# Patient Record
Sex: Female | Born: 2000 | Race: White | Hispanic: No | Marital: Single | Smoking: Current some day smoker
Health system: Southern US, Community
[De-identification: ages and names within clinical notes are randomized; demographics above are authoritative.]

## PROBLEM LIST (undated history)

## (undated) DIAGNOSIS — Z789 Other specified health status: Secondary | ICD-10-CM

## (undated) DIAGNOSIS — F32A Depression, unspecified: Secondary | ICD-10-CM

## (undated) DIAGNOSIS — F419 Anxiety disorder, unspecified: Secondary | ICD-10-CM

## (undated) HISTORY — DX: Anxiety disorder, unspecified: F41.9

## (undated) HISTORY — DX: Depression, unspecified: F32.A

---

## 2021-01-22 ENCOUNTER — Emergency Department (EMERGENCY_DEPARTMENT_HOSPITAL)
Admission: EM | Admit: 2021-01-22 | Discharge: 2021-01-24 | Disposition: A | Discharge status: Other Acute Inpatient Hospital | Payer: PRIVATE HEALTH INSURANCE | Source: Home / Self Care | Attending: Emergency Medicine | Admitting: Emergency Medicine

## 2021-01-22 ENCOUNTER — Other Ambulatory Visit: Payer: Self-pay

## 2021-01-22 DIAGNOSIS — Z20822 Contact with and (suspected) exposure to covid-19: Secondary | ICD-10-CM | POA: Insufficient documentation

## 2021-01-22 DIAGNOSIS — F17210 Nicotine dependence, cigarettes, uncomplicated: Secondary | ICD-10-CM | POA: Insufficient documentation

## 2021-01-22 DIAGNOSIS — R55 Syncope and collapse: Secondary | ICD-10-CM | POA: Diagnosis not present

## 2021-01-22 DIAGNOSIS — R4182 Altered mental status, unspecified: Secondary | ICD-10-CM | POA: Insufficient documentation

## 2021-01-22 DIAGNOSIS — F23 Brief psychotic disorder: Secondary | ICD-10-CM

## 2021-01-22 DIAGNOSIS — F411 Generalized anxiety disorder: Secondary | ICD-10-CM | POA: Insufficient documentation

## 2021-01-22 DIAGNOSIS — Y900 Blood alcohol level of less than 20 mg/100 ml: Secondary | ICD-10-CM | POA: Insufficient documentation

## 2021-01-22 LAB — CBC WITH DIFFERENTIAL/PLATELET
Abs Immature Granulocytes: 0.01 10*3/uL (ref 0.00–0.07)
Basophils Absolute: 0 10*3/uL (ref 0.0–0.1)
Basophils Relative: 0 %
Eosinophils Absolute: 0 10*3/uL (ref 0.0–0.5)
Eosinophils Relative: 0 %
HCT: 42.5 % (ref 36.0–46.0)
Hemoglobin: 15 g/dL (ref 12.0–15.0)
Immature Granulocytes: 0 %
Lymphocytes Relative: 14 %
Lymphs Abs: 1.2 10*3/uL (ref 0.7–4.0)
MCH: 28.8 pg (ref 26.0–34.0)
MCHC: 35.3 g/dL (ref 30.0–36.0)
MCV: 81.7 fL (ref 80.0–100.0)
Monocytes Absolute: 0.8 10*3/uL (ref 0.1–1.0)
Monocytes Relative: 9 %
Neutro Abs: 6.5 10*3/uL (ref 1.7–7.7)
Neutrophils Relative %: 77 %
Platelets: 370 10*3/uL (ref 150–400)
RBC: 5.2 MIL/uL — ABNORMAL HIGH (ref 3.87–5.11)
RDW: 13.3 % (ref 11.5–15.5)
WBC: 8.5 10*3/uL (ref 4.0–10.5)
nRBC: 0 % (ref 0.0–0.2)

## 2021-01-22 LAB — COMPREHENSIVE METABOLIC PANEL
ALT: 16 U/L (ref 0–44)
AST: 26 U/L (ref 15–41)
Albumin: 5.3 g/dL — ABNORMAL HIGH (ref 3.5–5.0)
Alkaline Phosphatase: 23 U/L — ABNORMAL LOW (ref 38–126)
Anion gap: 12 (ref 5–15)
BUN: 10 mg/dL (ref 6–20)
CO2: 20 mmol/L — ABNORMAL LOW (ref 22–32)
Calcium: 9.9 mg/dL (ref 8.9–10.3)
Chloride: 107 mmol/L (ref 98–111)
Creatinine, Ser: 0.65 mg/dL (ref 0.44–1.00)
GFR, Estimated: >60 mL/min (ref >60)
Glucose, Bld: 88 mg/dL (ref 70–99)
Potassium: 4.1 mmol/L (ref 3.5–5.1)
Sodium: 139 mmol/L (ref 135–145)
Total Bilirubin: 2.6 mg/dL — ABNORMAL HIGH (ref 0.3–1.2)
Total Protein: 8.2 g/dL — ABNORMAL HIGH (ref 6.5–8.1)

## 2021-01-22 LAB — URINALYSIS, COMPLETE (UACMP) WITH MICROSCOPIC
Bilirubin Urine: NEGATIVE
Glucose, UA: NEGATIVE mg/dL
Ketones, ur: >160 mg/dL — AB
Nitrite: NEGATIVE
Protein, ur: NEGATIVE mg/dL
Specific Gravity, Urine: 1.015 (ref 1.005–1.030)
pH: 6 (ref 5.0–8.0)

## 2021-01-22 LAB — PREGNANCY, URINE: Preg Test, Ur: NEGATIVE

## 2021-01-22 LAB — URINE DRUG SCREEN, QUALITATIVE (ARMC ONLY)
Amphetamines, Ur Screen: NOT DETECTED
Barbiturates, Ur Screen: NOT DETECTED
Benzodiazepine, Ur Scrn: NOT DETECTED
Cannabinoid 50 Ng, Ur ~~LOC~~: POSITIVE — AB
Cocaine Metabolite,Ur ~~LOC~~: NOT DETECTED
MDMA (Ecstasy)Ur Screen: NOT DETECTED
Methadone Scn, Ur: NOT DETECTED
Opiate, Ur Screen: NOT DETECTED
Phencyclidine (PCP) Ur S: NOT DETECTED
Tricyclic, Ur Screen: NOT DETECTED

## 2021-01-22 LAB — SALICYLATE LEVEL: Salicylate Lvl: <7 mg/dL — ABNORMAL LOW (ref 7.0–30.0)

## 2021-01-22 LAB — ACETAMINOPHEN LEVEL: Acetaminophen (Tylenol), Serum: <10 ug/mL — ABNORMAL LOW (ref 10–30)

## 2021-01-22 LAB — ETHANOL: Alcohol, Ethyl (B): <10 mg/dL (ref <10)

## 2021-01-22 MED ORDER — TRAZODONE HCL 50 MG PO TABS
50.0000 mg | ORAL_TABLET | Freq: Every evening | ORAL | Status: DC | PRN
  Administered 2021-01-22 – 2021-01-23 (×2): 50 mg via ORAL
  Filled 2021-01-22 (×2): qty 1

## 2021-01-22 NOTE — BH Assessment (Signed)
Comprehensive Clinical Assessment (CCA) Note  01/22/2021 Sandra [name] [id number]  Chief Complaint: Patient is a 20 year old female presenting to [facility] ED voluntarily. Per triage note Pt [facility] [employer]. States roommates, counselor did wellness check d/t abnormal behavior, pt not getting out of bed until 10 pm last night. Told roommate "I feel like the universe is telling me I should shower, should I?" Pt denies si/hi. Endorses that she is having difficulty adjusting here and was isolated in her room for a long time d/t having covid. Pt somewhat answer questions in altered way, has to be asked questions several times in different forms. When asked about pain, pt states "when I had covid, I had time to sit and think about what hurts." During assessment patient appears alert and oriented x4, calm and cooperative, patient's speech is slow and soft with very intense eye contact. 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 Patient is also able to report that she smokes marijuana, normally patient gets her marijuana from the same person but she also reports that she recently got it from a different person. Per ER staff patient reported to a nurse that she had Shrooms today." No current UDS available. Patient denies SI.   Per Psyc NP Lerry Linerashaun Dixon patient to be observed overnight and reassessed  Chief Complaint  Patient presents with   Psychiatric Evaluation   Visit Diagnosis: Altered Mental Status, Suspected Substance Use     CCA Screening, Triage and Referral (STR)  Patient Reported Information How did you hear about us? School/University  Referral name: No data recorded Referral phone number: No data recorded  Whom do you see for routine medical problems? No data recorded Practice/Facility Name: No data recorded Practice/Facility Phone Number: No data recorded Name of Contact: No data recorded Contact Number: No data recorded Contact Fax Number: No data recorded Prescriber Name: No data recorded Prescriber Address (if known): No data recorded  What Is the Reason for Your Visit/Call Today? [facility] due to bizarre behavior  How Long Has This Been Causing You Problems? <Week  What Do You Feel Would Help You the Most Today? No data recorded  Have You Recently Been in Any Inpatient Treatment (Hospital/Detox/Crisis Center/28-Day Program)? No data recorded Name/Location of Program/Hospital:No data recorded How Long Were You There? No data recorded When Were You Discharged? No data recorded  Have You Ever Received Services From [facility] Before? No data recorded Who Do You See at [facility]? No data recorded  Have You Recently Had Any Thoughts About Hurting Yourself? No  Are You Planning to Commit Suicide/Harm Yourself At This time? No   Have you Recently Had Thoughts About Hurting Someone Karolee Ohslse? No  Explanation: No data recorded  Have You Used Any Alcohol or Drugs in the Past 24 Hours? Yes  How Long Ago Did You Use Drugs or Alcohol? No data recorded What Did You Use and How Much? Marijuana and suspected "Shrooms"   Do You  Currently Have a Therapist/Psychiatrist? No  Name of Therapist/Psychiatrist: No data recorded  Have You Been Recently Discharged From Any Office Practice or Programs? No  Explanation of Discharge From Practice/Program: No data recorded    CCA Screening Triage Referral Assessment Type of Contact: Face-to-Face  Is  this Initial or Reassessment? No data recorded Date Telepsych consult ordered in [facility]:  No data recorded Time Telepsych consult ordered in [facility]:  No data recorded  Patient Reported Information Reviewed? No data recorded Patient Left Without Being Seen? No data recorded Reason for Not Completing Assessment: No data recorded  Collateral Involvement: No data recorded  Does Patient Have a Court Appointed Legal Guardian? No data recorded Name and Contact of Legal Guardian: No data recorded If Minor and Not Living with Parent(s), Who has Custody? No data recorded Is CPS involved or ever been involved? Never  Is APS involved or ever been involved? Never   Patient Determined To Be At Risk for Harm To Self or Others Based on Review of Patient Reported Information or Presenting Complaint? No  Method: No data recorded Availability of Means: No data recorded Intent: No data recorded Notification Required: No data recorded Additional Information for Danger to Others Potential: No data recorded Additional Comments for Danger to Others Potential: No data recorded Are There Guns or Other Weapons in Your Home? No data recorded Types of Guns/Weapons: No data recorded Are These Weapons Safely Secured?                            No data recorded Who Could Verify You Are Able To Have These Secured: No data recorded Do You Have any Outstanding Charges, Pending Court Dates, Parole/Probation? No data recorded Contacted To Inform of Risk of Harm To Self or Others: No data recorded  Location of Assessment: [facility] ED   Does Patient Present under Involuntary Commitment? No  IVC Papers Initial File Date: No data recorded  [place] of Residence: [place]   Patient Currently Receiving the Following Services: No data recorded  Determination of Need: Emergent (2 hours)   Options For Referral: No data recorded    CCA Biopsychosocial Intake/Chief Complaint:  No data recorded Current Symptoms/Problems:  No data recorded  Patient Reported Schizophrenia/Schizoaffective Diagnosis in Past: No   Strengths: Patient is able to communicate her needs  Preferences: No data recorded Abilities: No data recorded  Type of Services Patient Feels are Needed: No data recorded  Initial Clinical Notes/Concerns: No data recorded  Mental Health Symptoms Depression:   None   Duration of Depressive symptoms: No data recorded  Mania:   None   Anxiety:    Worrying; Difficulty concentrating   Psychosis:   Grossly disorganized or catatonic behavior   Duration of Psychotic symptoms:  Less than six months   Trauma:   None   Obsessions:   None   Compulsions:   None   Inattention:   None   Hyperactivity/Impulsivity:   None   Oppositional/Defiant Behaviors:   None   Emotional Irregularity:   None   Other Mood/Personality Symptoms:  No data recorded   Mental Status Exam Appearance and self-care  Stature:   Average   Weight:   Average weight   Clothing:   Casual   Grooming:   Normal   Cosmetic use:   None   Posture/gait:   Normal   Motor activity:   Not Remarkable   Sensorium  Attention:   Normal  Concentration:   Scattered   Orientation:   X5   Recall/memory:   Normal   Affect and Mood  Affect:   Labile   Mood:   Euphoric   Relating  Eye contact:   Normal   Facial expression:   Responsive   Attitude toward examiner:   Cooperative   Thought and Language  Speech flow:  Slow   Thought content:   Appropriate to Mood and Circumstances   Preoccupation:   None   Hallucinations:   None   Organization:  No data recorded  [employer] of Knowledge:   Fair   Intelligence:   Average   Abstraction:   Functional   Judgement:   Impaired   Reality Testing:   Realistic   Insight:   Fair; Flashes of insight   Decision Making:   Normal   Social Functioning  Social Maturity:   Responsible   Social Judgement:    Normal   Stress  Stressors:   Other (Comment)   Coping Ability:   Normal   Skill Deficits:   None   Supports:   Family; Friends/Service system     Religion: Religion/Spirituality Are You A Religious Person?: No  Leisure/Recreation: Leisure / Recreation Do You Have Hobbies?: No  Exercise/Diet: Exercise/Diet Do You Exercise?: No Have You Gained or Lost A Significant Amount of Weight in the Past Six Months?: No Do You Follow a Special Diet?: No Do You Have Any Trouble Sleeping?: No   CCA Employment/Education Employment/Work Situation: Employment / Work Situation Employment Situation: Surveyor, minerals Job has Been Impacted by Current Illness: No Has Patient ever Been in the [employer]?: No  Education: Education Is Patient Currently Attending School?: Yes School Currently Attending: [employer] Did You Attend College?: Yes What Type of College Degree Do you Have?: Currently attending college Did You Have An Individualized Education Program (IIEP): No Did You Have Any Difficulty At School?: No Patient's Education Has Been Impacted by Current Illness: No   CCA Family/Childhood History Family and Relationship History: Family history Marital status: Single Does patient have children?: No  Childhood History:  Childhood History By whom was/is the patient raised?: Both parents Did patient suffer any verbal/emotional/physical/sexual abuse as a child?: No Did patient suffer from severe childhood neglect?: No Has patient ever been sexually abused/assaulted/raped as an adolescent or adult?: No Was the patient ever a victim of a crime or a disaster?: No Witnessed domestic violence?: No Has patient been affected by domestic violence as an adult?: No  Child/Adolescent Assessment:     CCA Substance Use Alcohol/Drug Use: Alcohol / Drug Use Pain Medications: See MAR Prescriptions: See MAR Over the Counter: See MAR History of alcohol / drug use?:  Yes Substance #1 Name of Substance 1: Marijuana 1 - Frequency: Patient reports that she smokes often 1 - Last Use / Amount: 01/22/21 1- Route of Use: Smoking                       ASAM's:  Six Dimensions of Multidimensional Assessment  Dimension 1:  Acute Intoxication and/or Withdrawal Potential:      Dimension 2:  Biomedical Conditions and Complications:      Dimension 3:  Emotional, Behavioral, or Cognitive Conditions and Complications:     Dimension 4:  Readiness to Change:     Dimension 5:  Relapse, Continued use, or Continued Problem Potential:     Dimension 6:  Recovery/Living Environment:     ASAM Severity  Score:    ASAM Recommended Level of Treatment:     Substance use Disorder (SUD) Substance Use Disorder (SUD)  Checklist Symptoms of Substance Use: Continued use despite having a persistent/recurrent physical/psychological problem caused/exacerbated by use, Presence of craving or strong urge to use, Continued use despite persistent or recurrent social, interpersonal problems, caused or exacerbated by use  Recommendations for Services/Supports/Treatments:  Reassess  DSM5 Diagnoses: There are no problems to display for this patient.   Patient Centered Plan: Patient is on the following Treatment Plan(s):  Impulse Control and Substance Abuse   Referrals to Alternative Service(s): Referred to Alternative Service(s):   Place:   Date:   Time:    Referred to Alternative Service(s):   Place:   Date:   Time:    Referred to Alternative Service(s):   Place:   Date:   Time:    Referred to Alternative Service(s):   Place:   Date:   Time:     Taris Galindo A Derotha Fishbaugh, LCAS-A

## 2021-01-22 NOTE — ED Provider Notes (Signed)
[facility] Emergency Department Provider Note  ____________________________________________   Event Date/Time   First MD Initiated Contact with Patient 01/22/21 1707     (approximate)  I have reviewed the triage vital signs and the nursing notes.   HISTORY  Chief Complaint Psychiatric Evaluation   HPI Sandra [name] is a 20 y.o. female who was sent from [place].  Her roommates and counselor did a wellness check.  Patient reportedly did not get out of bed until 10:00 last night.  She reportedly told her roommate "I feel the universe is telling me I should shower, should I ."Patient says she came here from [facility].  She misses her people in [place].  She was quarantined in her room for a week and can even go outside and that was very bad for her.  She tells me several times "I miss my people I do not know where they are."  Most of the time she has appropriate but occasionally she seems to give me unusual or strange answers to my questions and took 3 or 4 questions before she would tell me that Mcqueen died when I asked her what happened on the news that was very significant.  She denies any headache or chest pain or belly ache or any other problems.  She says she drinks alcohol smokes cigarettes and marijuana.  She did not do any of that today.  She is not homicidal or suicidal.  She just seems slightly off or unusual.         History reviewed. No pertinent past medical history.  There are no problems to display for this patient.   History reviewed. No pertinent surgical history.  Prior to Admission medications   Not on File    Allergies Patient has no allergy information on record.  No family history on file.  Social History Social History   Substance Use Topics   Alcohol use: Yes   Drug use: Yes    Types: Marijuana    Review of Systems  Constitutional: No fever/chills Eyes: No visual changes. ENT: No sore throat. Cardiovascular:  Denies chest pain. Respiratory: Denies shortness of breath. Gastrointestinal: No abdominal pain.  No nausea, no vomiting.  No diarrhea.  No constipation. Genitourinary: Negative for dysuria. Musculoskeletal: Negative for back pain. Skin: Negative for rash. Neurological: Negative for headaches, focal weakness   ____________________________________________   PHYSICAL EXAM:  VITAL SIGNS: ED Triage Vitals  Enc Vitals Group     BP 01/22/21 1700 118/85     Pulse Rate 01/22/21 1700 91     Resp 01/22/21 1700 18     Temp 01/22/21 1700 98.6 F (37 C)     Temp Source 01/22/21 1700 Oral     SpO2 01/22/21 1700 100 %     Weight 01/22/21 1701 140 lb (63.5 kg)     Height --      Head Circumference --      Peak Flow --      Pain Score 01/22/21 1701 0     Pain Loc --      Pain Edu? --      Excl. in GC? --     Constitutional: Alert and oriented. Well appearing and in no acute distress. Eyes: Conjunctivae are normal. PER EOMI. Head: Atraumatic. Nose: No congestion/rhinnorhea. Mouth/Throat: Mucous membranes are moist.  Oropharynx non-erythematous. Neck: No stridor.   Cardiovascular: Normal rate, regular rhythm. Grossly normal heart sounds.  Good peripheral circulation. Respiratory: Normal respiratory effort.  No  retractions. Lungs CTAB. Gastrointestinal: Soft and nontender. No distention. No abdominal bruits.  Musculoskeletal: No lower extremity tenderness nor edema.   Neurologic:  Normal speech and language. No gross focal neurologic deficits are appreciated.  Skin:  Skin is warm, dry and intact. No rash noted.   ____________________________________________   LABS (all labs ordered are listed, but only abnormal results are displayed)  Labs Reviewed  COMPREHENSIVE METABOLIC PANEL - Abnormal; Notable for the following components:      Result Value   CO2 20 (*)    Total Protein 8.2 (*)    Albumin 5.3 (*)    Alkaline Phosphatase 23 (*)    Total Bilirubin 2.6 (*)    All other  components within normal limits  SALICYLATE LEVEL - Abnormal; Notable for the following components:   Salicylate Lvl <7.0 (*)    All other components within normal limits  ACETAMINOPHEN LEVEL - Abnormal; Notable for the following components:   Acetaminophen (Tylenol), Serum <10 (*)    All other components within normal limits  CBC WITH DIFFERENTIAL/PLATELET - Abnormal; Notable for the following components:   RBC 5.20 (*)    All other components within normal limits  ETHANOL  URINE DRUG SCREEN, QUALITATIVE ([facility] ONLY)  URINALYSIS, COMPLETE (UACMP) WITH MICROSCOPIC  POC URINE PREG, ED   ____________________________________________  EKG   ____________________________________________  RADIOLOGY Jill Poling, personally viewed and evaluated these images (plain radiographs) as part of my medical decision making, as well as reviewing the written report by the radiologist.  ED MD interpretation:    Official radiology report(s): No results found.  ____________________________________________   PROCEDURES  Procedure(s) performed (including Critical Care):  Procedures   ____________________________________________   INITIAL IMPRESSION / ASSESSMENT AND PLAN / ED COURSE  ----------------------------------------- 9:26 PM on 01/22/2021 ----------------------------------------- Psych and TTS see the patient.  Being managed to find out from her that she has used some shrooms here lately.  These appear to be psychedelic ones.  This likely explains her mental status.  We will likely watch her till they wear off.              ____________________________________________   FINAL CLINICAL IMPRESSION(S) / ED DIAGNOSES  Final diagnoses:  Altered mental status, unspecified altered mental status type     ED Discharge Orders     None        Note:  This document was prepared using Dragon voice recognition software and may include unintentional dictation  errors.    Arnaldo Natal, MD 01/22/21 2127

## 2021-01-22 NOTE — ED Triage Notes (Signed)
Pt to ED from [facility]. States roommates, counselor did wellness check d/t abnormal behavior, pt not getting out of bed until 10 pm last night. Told roommate "I feel like the universe is telling me I should shower, should I?"  Pt denies si/hi  Endorses that she is having difficulty adjusting here and was isolated in her room for a long time d/t having covid   Pt somewhat answer questions in altered way, has to be asked questions several times in different forms  When asked about pain, pt states "when I had covid, I had time to sit and think about what hurts"

## 2021-01-23 DIAGNOSIS — F411 Generalized anxiety disorder: Secondary | ICD-10-CM | POA: Diagnosis present

## 2021-01-23 MED ORDER — DULOXETINE HCL 20 MG PO CPEP
20.0000 mg | ORAL_CAPSULE | Freq: Two times a day (BID) | ORAL | Status: DC
  Administered 2021-01-23 (×2): 20 mg via ORAL
  Filled 2021-01-23 (×4): qty 1

## 2021-01-23 NOTE — ED Notes (Signed)
Mother requesting to speak with psychiatrist at this time. Lord NP notified and at bedside with family at this time to discuss treatment plan.

## 2021-01-23 NOTE — ED Notes (Signed)
Pt given lunch tray at this time

## 2021-01-23 NOTE — ED Notes (Addendum)
Mother, Sandra [name], has just boarded a plane and is coming from [place] for daughter. She requests that her college daughter not be DC before she gets here. Message has been relayed to psych services (TTS) and ER Physician. Mother's number is on the chart.

## 2021-01-23 NOTE — ED Provider Notes (Signed)
Emergency Medicine Observation Re-evaluation Note  Sandra [name] is a 20 y.o. female, seen on rounds today.  Pt initially presented to the ED for complaints of Psychiatric Evaluation Currently, the patient is resting comfortably and voices no complaints.    Physical Exam  BP 125/68 (BP Location: Right Arm)   Pulse 85   Temp 97.6 F (36.4 C) (Oral)   Resp 14   Wt 63.5 kg   SpO2 100%  Physical Exam Gen: No acute distress  Resp: Normal rise and fall of chest Neuro: Moving all four extremities Psych: Resting currently, calm and cooperative when awake    ED Course / MDM  EKG:   I have reviewed the labs performed to date as well as medications administered while in observation.  Recent changes in the last 24 hours include no acute events overnight.  Plan  Current plan is for psychiatric reassessment for further disposition.  Psychosis may be substance related.  Sandra [name] is not under involuntary commitment.     Sandra [name], Layla Maw, DO 01/23/21 [phone]

## 2021-01-23 NOTE — BH Assessment (Signed)
Late NoteScientific laboratory technician and Psych NP (Jamison L.), spoke with patient's parents at length about the patient care and plan moving forward. Discussed different options and scenarios that would be best for the patient and her family. Parents decided to discussed it more together and update psych team with their decision.   Writer spoke with father in the hall, near patient's room. He asked about the discharge times and would it be possible to have her ready at a certain time for discharge. They plan on allowing patient to stay overnight and continue to be observed and discharge tomorrow (01/24/2021) into their care.  Writer received phone call from patient's mother, they are coming to visit the patient tomorrow at 12 noon. The plan is to have her discharge by 2:00pm so they can take her with them. They are flying back home tomorrow. Per the conversation with NP, they are asking for other options than Haldol or Risperdal, for a PRN in the event she may need it on the flight.

## 2021-01-23 NOTE — Consult Note (Signed)
[facility] Consult   Reason for Consult:  Psychiatric Evaluation Referring Physician:  EDP Patient Identification: Sandra [name] MRN:  [id number] Principal Diagnosis: Generalized anxiety disorder Diagnosis:  Principal Problem:   Generalized anxiety disorder   Total Time spent with patient: 1 hour  Subjective:   Sandra [name] is a 20 y.o. female patient admitted to ER from [facility]. Counselor did wellness check due to abnormal behavior .  Patient states "feeling all over the place","time is so weird", "trying to do my best",  "I am trying to make decisions. I don't want to make any decisions", disorganized.  She reports "there are different parts to me". During this encounter she reports mood  as "better" reports depression "1/10".  She denies any suicidal or homicidal ideations. She reports visual hallucinations. She reports "I see a yellow bird on the door". She further states that she was in isolation for COVID for "long time" and "did not like being alone". The patient answers the questions in altered ways, does not entirely answer the question and has to be asked the same question several times. Client inappropriately smiles or laughs at times.  Patient denies suicidal/self-harm/homicidal ideations.  Patient has remained calm throughout the assessment, confused and difficult to discern what is fact or fiction, poor historian.  She reports marijuana use, vaping, Delta 8, and mushrooms. Unsure of when she used last.  The RN that took care of her last night reports she told her she was using marijuana and mushrooms with her friends.  UDS+ cannabis.  Hopefully, she will clear with time, sleep, nutrition, and medications.  TTS, Ian Malkin, and this provider met with her mother and then with her mother and father about the differentials, questions, concerns, process, etc.  They want to "regroup" and call [employer].  Recommended small dose of Risperdal or Haldol to assist  cognition clearance.  They want to meet again later this afternoon when they have more information from [place].     Past Psychiatric History: none on file  Risk to Self:  none Risk to Others:  none Prior Inpatient Therapy:  declines Prior Outpatient Therapy:  she reports being on fluoxetine in th epast.   Past Medical History: History reviewed. No pertinent past medical history. History reviewed. No pertinent surgical history. Family History: No family history on file. Family Psychiatric  History: none on file Social History:  Social History   Substance and Sexual Activity  Alcohol Use Yes     Social History   Substance and Sexual Activity  Drug Use Yes   Types: Marijuana    Social History   Socioeconomic History   Marital status: Single    Spouse name: Not on file   Number of children: Not on file   Years of education: Not on file   Highest education level: Not on file  Occupational History   Not on file  Tobacco Use   Smoking status: Not on file   Smokeless tobacco: Not on file  Substance and Sexual Activity   Alcohol use: Yes   Drug use: Yes    Types: Marijuana   Sexual activity: Not on file  Other Topics Concern   Not on file  Social History Narrative   Not on file   Social Determinants of Health   Financial Resource Strain: Not on file  Food Insecurity: Not on file  Transportation Needs: Not on file  Physical Activity: Not on file  Stress: Not on file  Social Connections: Not  on file   Additional Social History:    Allergies:   Allergies  Allergen Reactions   Tobramycin Rash    Had it when a kid    Amoxicillin Rash    Reports rash    Labs:  Results for orders placed or performed during the hospital encounter of 01/22/21 (from the past 48 hour(s))  Comprehensive metabolic panel     Status: Abnormal   Collection Time: 01/22/21  5:03 PM  Result Value Ref Range   Sodium 139 135 - 145 mmol/L   Potassium 4.1 3.5 - 5.1 mmol/L   Chloride 107 98 -  111 mmol/L   CO2 20 (L) 22 - 32 mmol/L   Glucose, Bld 88 70 - 99 mg/dL    Comment: Glucose reference range applies only to samples taken after fasting for at least 8 hours.   BUN 10 6 - 20 mg/dL   Creatinine, Ser 0.65 0.44 - 1.00 mg/dL   Calcium 9.9 8.9 - 10.3 mg/dL   Total Protein 8.2 (H) 6.5 - 8.1 g/dL   Albumin 5.3 (H) 3.5 - 5.0 g/dL   AST 26 15 - 41 U/L   ALT 16 0 - 44 U/L   Alkaline Phosphatase 23 (L) 38 - 126 U/L   Total Bilirubin 2.6 (H) 0.3 - 1.2 mg/dL   GFR, Estimated >60 >60 mL/min    Comment: (NOTE) Calculated using the CKD-EPI Creatinine Equation (2021)    Anion gap 12 5 - 15    Comment: Performed at [facility], [address] [place], [place] [address]  Ethanol     Status: None   Collection Time: 01/22/21  5:03 PM  Result Value Ref Range   Alcohol, Ethyl (B) <10 <10 mg/dL    Comment: (NOTE) Lowest detectable limit for serum alcohol is 10 mg/dL.  For medical purposes only. Performed at [facility], [address] [place], [place] [address]   Salicylate level     Status: Abnormal   Collection Time: 01/22/21  5:03 PM  Result Value Ref Range   Salicylate Lvl <4.8 (L) 7.0 - 30.0 mg/dL    Comment: Performed at [facility], [address] [place], [place] [address]  Acetaminophen level     Status: Abnormal   Collection Time: 01/22/21  5:03 PM  Result Value Ref Range   Acetaminophen (Tylenol), Serum <10 (L) 10 - 30 ug/mL    Comment: (NOTE) Therapeutic concentrations vary significantly. A range of 10-30 ug/mL  may be an effective concentration for many patients. However, some  are best treated at concentrations outside of this range. Acetaminophen concentrations >150 ug/mL at 4 hours after ingestion  and >50 ug/mL at 12 hours after ingestion are often associated with  toxic reactions.  Performed at [facility], [address] [place], [place] [address]   CBC with Differential     Status: Abnormal   Collection  Time: 01/22/21  5:09 PM  Result Value Ref Range   WBC 8.5 4.0 - 10.5 K/uL   RBC 5.20 (H) 3.87 - 5.11 MIL/uL   Hemoglobin 15.0 12.0 - 15.0 g/dL   HCT 42.5 36.0 - 46.0 %   MCV 81.7 80.0 - 100.0 fL   MCH 28.8 26.0 - 34.0 pg   MCHC 35.3 30.0 - 36.0 g/dL   RDW 13.3 11.5 - 15.5 %   Platelets 370 150 - 400 K/uL   nRBC 0.0 0.0 - 0.2 %   Neutrophils Relative % 77 %  Neutro Abs 6.5 1.7 - 7.7 K/uL   Lymphocytes Relative 14 %   Lymphs Abs 1.2 0.7 - 4.0 K/uL   Monocytes Relative 9 %   Monocytes Absolute 0.8 0.1 - 1.0 K/uL   Eosinophils Relative 0 %   Eosinophils Absolute 0.0 0.0 - 0.5 K/uL   Basophils Relative 0 %   Basophils Absolute 0.0 0.0 - 0.1 K/uL   Immature Granulocytes 0 %   Abs Immature Granulocytes 0.01 0.00 - 0.07 K/uL    Comment: Performed at [facility], [address], [place], [place] [address]  Urine Drug Screen, Qualitative     Status: Abnormal   Collection Time: 01/22/21  5:24 PM  Result Value Ref Range   Tricyclic, Ur Screen NONE DETECTED NONE DETECTED   Amphetamines, Ur Screen NONE DETECTED NONE DETECTED   MDMA (Ecstasy)Ur Screen NONE DETECTED NONE DETECTED   Cocaine Metabolite,Ur [place] NONE DETECTED NONE DETECTED   Opiate, Ur Screen NONE DETECTED NONE DETECTED   Phencyclidine (PCP) Ur S NONE DETECTED NONE DETECTED   Cannabinoid 50 Ng, Ur [place] POSITIVE (A) NONE DETECTED   Barbiturates, Ur Screen NONE DETECTED NONE DETECTED   Benzodiazepine, Ur Scrn NONE DETECTED NONE DETECTED   Methadone Scn, Ur NONE DETECTED NONE DETECTED    Comment: (NOTE) Tricyclics + metabolites, urine    Cutoff 1000 ng/mL Amphetamines + metabolites, urine  Cutoff 1000 ng/mL MDMA (Ecstasy), urine              Cutoff 500 ng/mL Cocaine Metabolite, urine          Cutoff 300 ng/mL Opiate + metabolites, urine        Cutoff 300 ng/mL Phencyclidine (PCP), urine         Cutoff 25 ng/mL Cannabinoid, urine                 Cutoff 50 ng/mL Barbiturates + metabolites, urine  Cutoff 200  ng/mL Benzodiazepine, urine              Cutoff 200 ng/mL Methadone, urine                   Cutoff 300 ng/mL  The urine drug screen provides only a preliminary, unconfirmed analytical test result and should not be used for non-medical purposes. Clinical consideration and professional judgment should be applied to any positive drug screen result due to possible interfering substances. A more specific alternate chemical method must be used in order to obtain a confirmed analytical result. Gas chromatography / mass spectrometry (GC/MS) is the preferred confirm atory method. Performed at [facility], [address] [place], [place] [address]   Urinalysis, Complete w Microscopic     Status: Abnormal   Collection Time: 01/22/21  5:24 PM  Result Value Ref Range   Color, Urine YELLOW YELLOW   APPearance CLEAR CLEAR   Specific Gravity, Urine 1.015 1.005 - 1.030   pH 6.0 5.0 - 8.0   Glucose, UA NEGATIVE NEGATIVE mg/dL   Hgb urine dipstick LARGE (A) NEGATIVE   Bilirubin Urine NEGATIVE NEGATIVE   Ketones, ur >160 (A) NEGATIVE mg/dL   Protein, ur NEGATIVE NEGATIVE mg/dL   Nitrite NEGATIVE NEGATIVE   Leukocytes,Ua TRACE (A) NEGATIVE   Squamous Epithelial / LPF 0-5 0 - 5   WBC, UA 6-10 0 - 5 WBC/hpf   RBC / HPF 0-5 0 - 5 RBC/hpf   Bacteria, UA MANY (A) NONE SEEN   Mucus PRESENT     Comment: Performed at  [facility], [address] [place], [place] [address]  Pregnancy, urine     Status: None   Collection Time: 01/22/21  5:24 PM  Result Value Ref Range   Preg Test, Ur NEGATIVE NEGATIVE    Comment: Performed at [facility], [address] [place], [place] [address]    Current Facility-Administered Medications  Medication Dose Route Frequency Provider Last Rate Last Admin   DULoxetine (CYMBALTA) DR capsule 20 mg  20 mg Oral BID Patrecia Pour, NP       traZODone (DESYREL) tablet 50 mg  50 mg Oral QHS PRN Deloria Lair, NP   50 mg at 01/22/21 2303    Current Outpatient Medications  Medication Sig Dispense Refill   DULoxetine (CYMBALTA) 20 MG capsule Take 20 mg by mouth 2 (two) times daily.     [facility] 1/35 1-35 MG-MCG tablet Take 1 tablet by mouth daily.      Musculoskeletal: Strength & Muscle Tone: within normal limits Gait & Station: normal Patient leans: N/A  Psychiatric Specialty Exam: Physical Exam Vitals and nursing note reviewed.  Constitutional:      Appearance: Normal appearance.  HENT:     Head: Normocephalic.     Nose: Nose normal.  Pulmonary:     Effort: Pulmonary effort is normal.  Musculoskeletal:        General: Normal range of motion.     Cervical back: Normal range of motion.  Neurological:     General: No focal deficit present.     Mental Status: She is alert and oriented to person, place, and time.    Review of Systems  Blood pressure 119/67, pulse 76, temperature 98.1 F (36.7 C), temperature source Oral, resp. rate 17, weight 63.5 kg, SpO2 100 %.There is no height or weight on file to calculate BMI.  General Appearance: Casual  Eye Contact:  Fair  Speech:  Slow  Volume:  Decreased  Mood:  Anxious  Affect:  Non-Congruent  Thought Process:  Disorganized  Orientation:  Full (Time, Place, and Person)  Thought Content:  Delusions and Hallucinations: Visual  Suicidal Thoughts:  No  Homicidal Thoughts:  No  Memory:  Immediate;   Fair Recent;   Poor Remote;   Fair  Judgement:  Impaired  Insight:  Lacking  Psychomotor Activity:  Decreased  Concentration:  Concentration: Poor and Attention Span: Poor  Recall:  Poor  Fund of Knowledge:  Fair  Language:  Fair  Akathisia:  No  Handed:  Right  AIMS (if indicated):     Assets:  Housing Leisure Time Physical Health Resilience Social Support  ADL's:  Intact  Cognition:  Impaired,  Mild  Sleep:        Physical Exam: Physical Exam Vitals and nursing note reviewed.  Constitutional:      Appearance: Normal appearance.  HENT:     Head:  Normocephalic.     Nose: Nose normal.  Pulmonary:     Effort: Pulmonary effort is normal.  Musculoskeletal:        General: Normal range of motion.     Cervical back: Normal range of motion.  Neurological:     General: No focal deficit present.     Mental Status: She is alert and oriented to person, place, and time.   ROS Blood pressure 119/67, pulse 76, temperature 98.1 F (36.7 C), temperature source Oral, resp. rate 17, weight 63.5 kg, SpO2 100 %. There is no height or weight on file to calculate BMI.  Treatment Plan Summary: Daily contact with patient to assess and evaluate symptoms and progress in treatment.  General anxiety disorder: -Restarted Cymbalta 20 mg daily  Disposition: Reassess in 24 hours Waylan Boga, NP 01/23/2021 1:27 PM

## 2021-01-24 ENCOUNTER — Encounter: Payer: Self-pay | Admitting: Psychiatry

## 2021-01-24 ENCOUNTER — Inpatient Hospital Stay
Admission: AD | Admit: 2021-01-24 | Discharge: 2021-01-25 | DRG: 885 | Disposition: A | Discharge status: Other Acute Inpatient Hospital | Payer: PRIVATE HEALTH INSURANCE | Source: Intra-hospital | Attending: Psychiatry | Admitting: Psychiatry

## 2021-01-24 ENCOUNTER — Other Ambulatory Visit: Payer: Self-pay

## 2021-01-24 DIAGNOSIS — F17210 Nicotine dependence, cigarettes, uncomplicated: Secondary | ICD-10-CM | POA: Diagnosis present

## 2021-01-24 DIAGNOSIS — R55 Syncope and collapse: Secondary | ICD-10-CM | POA: Diagnosis not present

## 2021-01-24 DIAGNOSIS — F23 Brief psychotic disorder: Secondary | ICD-10-CM

## 2021-01-24 DIAGNOSIS — F29 Unspecified psychosis not due to a substance or known physiological condition: Principal | ICD-10-CM | POA: Diagnosis present

## 2021-01-24 DIAGNOSIS — Z880 Allergy status to penicillin: Secondary | ICD-10-CM

## 2021-01-24 DIAGNOSIS — Z888 Allergy status to other drugs, medicaments and biological substances status: Secondary | ICD-10-CM

## 2021-01-24 DIAGNOSIS — F419 Anxiety disorder, unspecified: Secondary | ICD-10-CM | POA: Diagnosis present

## 2021-01-24 DIAGNOSIS — Z79899 Other long term (current) drug therapy: Secondary | ICD-10-CM

## 2021-01-24 DIAGNOSIS — R32 Unspecified urinary incontinence: Secondary | ICD-10-CM | POA: Diagnosis not present

## 2021-01-24 DIAGNOSIS — E440 Moderate protein-calorie malnutrition: Secondary | ICD-10-CM | POA: Diagnosis not present

## 2021-01-24 DIAGNOSIS — F411 Generalized anxiety disorder: Secondary | ICD-10-CM | POA: Diagnosis not present

## 2021-01-24 HISTORY — DX: Other specified health status: Z78.9

## 2021-01-24 LAB — RESP PANEL BY RT-PCR (FLU A&B, COVID) ARPGX2
Influenza A by PCR: NEGATIVE
Influenza B by PCR: NEGATIVE
SARS Coronavirus 2 by RT PCR: NEGATIVE

## 2021-01-24 LAB — GLUCOSE, CAPILLARY: Glucose-Capillary: 99 mg/dL (ref 70–99)

## 2021-01-24 MED ORDER — HYDROXYZINE HCL 25 MG PO TABS
25.0000 mg | ORAL_TABLET | Freq: Three times a day (TID) | ORAL | Status: DC | PRN
  Administered 2021-01-24: 25 mg via ORAL
  Filled 2021-01-24: qty 1

## 2021-01-24 MED ORDER — TRAZODONE HCL 50 MG PO TABS
50.0000 mg | ORAL_TABLET | Freq: Every evening | ORAL | Status: DC | PRN
  Administered 2021-01-24: 50 mg via ORAL
  Filled 2021-01-24: qty 1

## 2021-01-24 MED ORDER — LORAZEPAM 0.5 MG PO TABS
0.5000 mg | ORAL_TABLET | ORAL | Status: DC | PRN
  Administered 2021-01-24: 0.5 mg via ORAL
  Filled 2021-01-24: qty 1

## 2021-01-24 MED ORDER — OLANZAPINE 5 MG PO TABS
5.0000 mg | ORAL_TABLET | Freq: Every day | ORAL | Status: DC
  Administered 2021-01-24: 5 mg via ORAL
  Filled 2021-01-24: qty 1

## 2021-01-24 MED ORDER — ACETAMINOPHEN 325 MG PO TABS: 650.0000 mg | ORAL_TABLET | Freq: Four times a day (QID) | ORAL | Status: DC | PRN

## 2021-01-24 MED ORDER — DULOXETINE HCL 20 MG PO CPEP
20.0000 mg | ORAL_CAPSULE | Freq: Two times a day (BID) | ORAL | Status: DC
  Filled 2021-01-24 (×2): qty 1

## 2021-01-24 MED ORDER — MAGNESIUM HYDROXIDE 400 MG/5ML PO SUSP: 30.0000 mL | Freq: Every day | ORAL | Status: DC | PRN

## 2021-01-24 MED ORDER — ALUM & MAG HYDROXIDE-SIMETH 200-200-20 MG/5ML PO SUSP: 30.0000 mL | ORAL | Status: DC | PRN

## 2021-01-24 NOTE — ED Notes (Signed)
Pt parents are visiting.

## 2021-01-24 NOTE — Consult Note (Signed)
[facility] Face-to-Face Psychiatry Consult   Reason for Consult: Consult follow-up on this 20 year old woman and student at [employer] brought over because of several days of disorganization and poor p.o. intake confused thinking and psychosis Referring Physician: Fuller Plan Patient Identification: Sandra [name] MRN:  [id number] Principal Diagnosis: Brief reactive psychosis (HCC) Diagnosis:  Principal Problem:   Brief reactive psychosis (HCC) Active Problems:   Generalized anxiety disorder   Total Time spent with patient: 45 minutes  Subjective:   Sandra [name] is a 20 y.o. female patient admitted with "I do not know what to think".  HPI: Patient seen chart reviewed.  Spoke with the patient's parents who are on site as well.  69 year old came over from [facility] over the weekend with what sounds like new-onset psychotic symptoms disorganized thinking extreme withdrawal from friends paranoia not eating or drinking well.  On evaluation today found the patient disheveled and poorly groomed.  Passive in her interaction.  Only intermittent eye contact.  Tearful during the exam.  Speaking very quietly and with significant thought blocking.  Disorganized thinking with thought derailment and confusion.  Answers many questions with non sequitur is and other questions.  Patient shows some evidence of understanding that she is in a hospital but also seems to still be preoccupied by confused thoughts and to be responding to internal stimuli.  Past Psychiatric History: Past history of some anxiety symptoms for which she has been seen by therapist and physician especially at home in [place].  No known previous hospitalizations no history of suicide attempts or violence.  Risk to Self:   Risk to Others:   Prior Inpatient Therapy:   Prior Outpatient Therapy:    Past Medical History: History reviewed. No pertinent past medical history. History reviewed. No pertinent surgical history. Family History: No family  history on file. Family Psychiatric  History: None reported Social History:  Social History   Substance and Sexual Activity  Alcohol Use Yes     Social History   Substance and Sexual Activity  Drug Use Yes   Types: Marijuana    Social History   Socioeconomic History   Marital status: Single    Spouse name: Not on file   Number of children: Not on file   Years of education: Not on file   Highest education level: Not on file  Occupational History   Not on file  Tobacco Use   Smoking status: Not on file   Smokeless tobacco: Not on file  Substance and Sexual Activity   Alcohol use: Yes   Drug use: Yes    Types: Marijuana   Sexual activity: Not on file  Other Topics Concern   Not on file  Social History Narrative   Not on file   Social Determinants of Health   Financial Resource Strain: Not on file  Food Insecurity: Not on file  Transportation Needs: Not on file  Physical Activity: Not on file  Stress: Not on file  Social Connections: Not on file   Additional Social History:    Allergies:   Allergies  Allergen Reactions   Tobramycin Rash    Had it when a kid    Amoxicillin Rash    Reports rash    Labs:  Results for orders placed or performed during the hospital encounter of 01/22/21 (from the past 48 hour(s))  Comprehensive metabolic panel     Status: Abnormal   Collection Time: 01/22/21  5:03 PM  Result Value Ref Range   Sodium 139 135 -  145 mmol/L   Potassium 4.1 3.5 - 5.1 mmol/L   Chloride 107 98 - 111 mmol/L   CO2 20 (L) 22 - 32 mmol/L   Glucose, Bld 88 70 - 99 mg/dL    Comment: Glucose reference range applies only to samples taken after fasting for at least 8 hours.   BUN 10 6 - 20 mg/dL   Creatinine, Ser 4.40 0.44 - 1.00 mg/dL   Calcium 9.9 8.9 - 34.7 mg/dL   Total Protein 8.2 (H) 6.5 - 8.1 g/dL   Albumin 5.3 (H) 3.5 - 5.0 g/dL   AST 26 15 - 41 U/L   ALT 16 0 - 44 U/L   Alkaline Phosphatase 23 (L) 38 - 126 U/L   Total Bilirubin 2.6 (H)  0.3 - 1.2 mg/dL   GFR, Estimated >42 >59 mL/min    Comment: (NOTE) Calculated using the CKD-EPI Creatinine Equation (2021)    Anion gap 12 5 - 15    Comment: Performed at [facility], [address] [place], [place] [address]  Ethanol     Status: None   Collection Time: 01/22/21  5:03 PM  Result Value Ref Range   Alcohol, Ethyl (B) <10 <10 mg/dL    Comment: (NOTE) Lowest detectable limit for serum alcohol is 10 mg/dL.  For medical purposes only. Performed at [facility], [address] [place], [place] [address]   Salicylate level     Status: Abnormal   Collection Time: 01/22/21  5:03 PM  Result Value Ref Range   Salicylate Lvl <7.0 (L) 7.0 - 30.0 mg/dL    Comment: Performed at [facility], [address] [place], [place] [address]  Acetaminophen level     Status: Abnormal   Collection Time: 01/22/21  5:03 PM  Result Value Ref Range   Acetaminophen (Tylenol), Serum <10 (L) 10 - 30 ug/mL    Comment: (NOTE) Therapeutic concentrations vary significantly. A range of 10-30 ug/mL  may be an effective concentration for many patients. However, some  are best treated at concentrations outside of this range. Acetaminophen concentrations >150 ug/mL at 4 hours after ingestion  and >50 ug/mL at 12 hours after ingestion are often associated with  toxic reactions.  Performed at [facility], [address] [place], [place] [address]   CBC with Differential     Status: Abnormal   Collection Time: 01/22/21  5:09 PM  Result Value Ref Range   WBC 8.5 4.0 - 10.5 K/uL   RBC 5.20 (H) 3.87 - 5.11 MIL/uL   Hemoglobin 15.0 12.0 - 15.0 g/dL   HCT 06.3 01.6 - 01.0 %   MCV 81.7 80.0 - 100.0 fL   MCH 28.8 26.0 - 34.0 pg   MCHC 35.3 30.0 - 36.0 g/dL   RDW 93.2 35.5 - 73.2 %   Platelets 370 150 - 400 K/uL   nRBC 0.0 0.0 - 0.2 %   Neutrophils Relative % 77 %   Neutro Abs 6.5 1.7 - 7.7 K/uL   Lymphocytes Relative 14 %   Lymphs Abs 1.2 0.7 - 4.0 K/uL    Monocytes Relative 9 %   Monocytes Absolute 0.8 0.1 - 1.0 K/uL   Eosinophils Relative 0 %   Eosinophils Absolute 0.0 0.0 - 0.5 K/uL   Basophils Relative 0 %   Basophils Absolute 0.0 0.0 - 0.1 K/uL   Immature Granulocytes 0 %   Abs Immature Granulocytes 0.01 0.00 - 0.07 K/uL    Comment: Performed at [facility], [address] [place], [place] [address]  Urine Drug Screen, Qualitative     Status: Abnormal   Collection Time: 01/22/21  5:24 PM  Result Value Ref Range   Tricyclic, Ur Screen NONE DETECTED NONE DETECTED   Amphetamines, Ur Screen NONE DETECTED NONE DETECTED   MDMA (Ecstasy)Ur Screen NONE DETECTED NONE DETECTED   Cocaine Metabolite,Ur [place] NONE DETECTED NONE DETECTED   Opiate, Ur Screen NONE DETECTED NONE DETECTED   Phencyclidine (PCP) Ur S NONE DETECTED NONE DETECTED   Cannabinoid 50 Ng, Ur [place] POSITIVE (A) NONE DETECTED   Barbiturates, Ur Screen NONE DETECTED NONE DETECTED   Benzodiazepine, Ur Scrn NONE DETECTED NONE DETECTED   Methadone Scn, Ur NONE DETECTED NONE DETECTED    Comment: (NOTE) Tricyclics + metabolites, urine    Cutoff 1000 ng/mL Amphetamines + metabolites, urine  Cutoff 1000 ng/mL MDMA (Ecstasy), urine              Cutoff 500 ng/mL Cocaine Metabolite, urine          Cutoff 300 ng/mL Opiate + metabolites, urine        Cutoff 300 ng/mL Phencyclidine (PCP), urine         Cutoff 25 ng/mL Cannabinoid, urine                 Cutoff 50 ng/mL Barbiturates + metabolites, urine  Cutoff 200 ng/mL Benzodiazepine, urine              Cutoff 200 ng/mL Methadone, urine                   Cutoff 300 ng/mL  The urine drug screen provides only a preliminary, unconfirmed analytical test result and should not be used for non-medical purposes. Clinical consideration and professional judgment should be applied to any positive drug screen result due to possible interfering substances. A more specific alternate chemical method must be used in order to obtain a  confirmed analytical result. Gas chromatography / mass spectrometry (GC/MS) is the preferred confirm atory method. Performed at [facility], [address] [place], [place] [address]   Urinalysis, Complete w Microscopic     Status: Abnormal   Collection Time: 01/22/21  5:24 PM  Result Value Ref Range   Color, Urine YELLOW YELLOW   APPearance CLEAR CLEAR   Specific Gravity, Urine 1.015 1.005 - 1.030   pH 6.0 5.0 - 8.0   Glucose, UA NEGATIVE NEGATIVE mg/dL   Hgb urine dipstick LARGE (A) NEGATIVE   Bilirubin Urine NEGATIVE NEGATIVE   Ketones, ur >160 (A) NEGATIVE mg/dL   Protein, ur NEGATIVE NEGATIVE mg/dL   Nitrite NEGATIVE NEGATIVE   Leukocytes,Ua TRACE (A) NEGATIVE   Squamous Epithelial / LPF 0-5 0 - 5   WBC, UA 6-10 0 - 5 WBC/hpf   RBC / HPF 0-5 0 - 5 RBC/hpf   Bacteria, UA MANY (A) NONE SEEN   Mucus PRESENT     Comment: Performed at [facility], [address] [place], [place] [address]  Pregnancy, urine     Status: None   Collection Time: 01/22/21  5:24 PM  Result Value Ref Range   Preg Test, Ur NEGATIVE NEGATIVE    Comment: Performed at [facility], [address] [place], [place] [address]    Current Facility-Administered Medications  Medication Dose Route Frequency Provider Last Rate Last Admin   DULoxetine (CYMBALTA) DR capsule 20 mg  20 mg Oral BID Charm RingsLord, Jamison Y, NP  20 mg at 01/23/21 2137   traZODone (DESYREL) tablet 50 mg  50 mg Oral QHS PRN Jearld Lesch, NP   50 mg at 01/23/21 2137   Current Outpatient Medications  Medication Sig Dispense Refill   DULoxetine (CYMBALTA) 20 MG capsule Take 20 mg by mouth 2 (two) times daily.     [facility] 1/35 1-35 MG-MCG tablet Take 1 tablet by mouth daily.      Musculoskeletal: Strength & Muscle Tone: within normal limits Gait & Station: normal Patient leans: N/A            Psychiatric Specialty Exam:  Presentation  General Appearance: Appropriate for Environment;  Casual  Eye Contact:Fair  Speech:Blocked  Speech Volume:Normal  Handedness:Right   Mood and Affect  Mood: No data recorded Affect:Inappropriate; Full Range   Thought Process  Thought Processes:Disorganized  Descriptions of Associations:Loose  Orientation:Full (Time, Place and Person)  Thought Content:Illogical  History of Schizophrenia/Schizoaffective disorder:No  Duration of Psychotic Symptoms:N/A  Hallucinations:No data recorded Ideas of Reference:None  Suicidal Thoughts:No data recorded Homicidal Thoughts:No data recorded  Sensorium  Memory:Immediate Fair; Recent Poor  Judgment:Impaired  Insight:Lacking   Executive Functions  Concentration:Poor  Attention Span:Poor  Recall:Poor  Fund of Knowledge:Poor  Language:Poor   Psychomotor Activity  Psychomotor Activity: No data recorded  Assets  Assets:Communication Skills; Financial Resources/Insurance; Housing; Vocational/Educational; Social Support   Sleep  Sleep: No data recorded  Physical Exam: Physical Exam Vitals and nursing note reviewed.  Constitutional:      Appearance: Normal appearance.  HENT:     Head: Normocephalic and atraumatic.     Mouth/Throat:     Pharynx: Oropharynx is clear.  Eyes:     Pupils: Pupils are equal, round, and reactive to light.  Cardiovascular:     Rate and Rhythm: Normal rate and regular rhythm.  Pulmonary:     Effort: Pulmonary effort is normal.     Breath sounds: Normal breath sounds.  Abdominal:     General: Abdomen is flat.     Palpations: Abdomen is soft.  Musculoskeletal:        General: Normal range of motion.  Skin:    General: Skin is warm and dry.  Neurological:     General: No focal deficit present.     Mental Status: She is alert. Mental status is at baseline.  Psychiatric:        Attention and Perception: She is inattentive.        Mood and Affect: Mood normal. Affect is labile and tearful.        Speech: She is noncommunicative.  Speech is tangential.        Behavior: Behavior is withdrawn. Behavior is not agitated or aggressive.        Thought Content: Thought content is paranoid and delusional.        Cognition and Memory: Memory is impaired.        Judgment: Judgment is inappropriate.   Review of Systems  Constitutional:  Positive for malaise/fatigue.  HENT: Negative.    Eyes: Negative.   Respiratory: Negative.    Cardiovascular: Negative.   Gastrointestinal: Negative.   Musculoskeletal: Negative.   Skin: Negative.   Neurological: Negative.   Psychiatric/Behavioral:  Positive for depression, hallucinations, memory loss and suicidal ideas. The patient is nervous/anxious and has insomnia.   Blood pressure 108/87, pulse 100, temperature 98 F (36.7 C), temperature source Oral, resp. rate 15, weight 63.5 kg, SpO2 100 %. There is no height or weight on file to  calculate BMI.  Treatment Plan Summary: Plan 20 year old woman is presenting with acute psychotic symptoms.  Does talk about having had some recent substance abuse although her history is not entirely reliable.  Remains confused and psychotic and unpredictable in her behavior in the emergency room.  Speaking with the family I suggested that the safest alternative would be hospitalization until she is more stable given that the longer term plan he is transfer back to [place] which will require a long airplane ride.  They expressed understanding.  Case is being reviewed with inpatient treatment team.  COVID test still pending otherwise medically stable.  Disposition: Recommend psychiatric Inpatient admission when medically cleared. Supportive therapy provided about ongoing stressors.  Mordecai Rasmussen, MD 01/24/2021 11:04 AM

## 2021-01-24 NOTE — ED Provider Notes (Signed)
Emergency Medicine Observation Re-evaluation Note  Sandra [name] is a 20 y.o. female, seen on rounds today.  Pt initially presented to the ED for complaints of Psychiatric Evaluation Currently, the patient is resting, voices no medical complaints.  Physical Exam  BP 110/65   Pulse 70   Temp 98 F (36.7 C) (Oral)   Resp 14   Wt 63.5 kg   SpO2 100%  Physical Exam General: Resting in no acute distress Cardiac: No cyanosis Lungs: Equal rise and fall Psych: Not agitated  ED Course / MDM  EKG:   I have reviewed the labs performed to date as well as medications administered while in observation.  Recent changes in the last 24 hours include no events overnight.  Plan  Current plan is for psychiatric disposition.  Sandra [name] is not under involuntary commitment.     Irean Hong, MD 01/24/21 [phone]

## 2021-01-24 NOTE — Plan of Care (Signed)
Continues to experience increased anxiety and disturbed thought process "I don't know what's going on with me, can you tell me..?". Patient is tearful and restless. Safety precautions reinforced.

## 2021-01-24 NOTE — ED Notes (Signed)
Report called to veronique rn BMU nurse

## 2021-01-24 NOTE — BH Assessment (Signed)
TTS and Dr. Weber Cooks met with patient for reassessment. Patient presents with disorganized thought and confusion. Patient was tearful during interview and somewhat unclear about why she was at the hospital. Patient is responding to internal stimuli. Patient reports no previous INPT treatment.   Per Dr. Weber Cooks, patient is recommended for inpatient psychiatric admission.

## 2021-01-24 NOTE — ED Notes (Signed)
VOL/pending reassessment/discharge in the afternoon.

## 2021-01-24 NOTE — Progress Notes (Signed)
Spoke with Elenore Paddy, PMHNP and patient is to be transferred to medical for overnight observation

## 2021-01-24 NOTE — ED Notes (Signed)
Pt tearful, asking for help on how to put on pants. When asked if she wanted to shower, states yes but then starts crying and states that people are asking her to "make a lot of decisions". Pt also crying states "why doesn't anyone understand me?"

## 2021-01-24 NOTE — Progress Notes (Signed)
Patient ID: Sandra [name], female   DOB: 09-Jan-2001, 20 y.o.   MRN: [id number] Patient presents involuntarily with altered thought process. Was referred by school counselor from [facility] secondary to increased bizarre behaviors. Upon this assessment, patient was restless, anxious, tearful and confused, reporting that "I don't know what is going on with me..". Patient admits that she has been smoking marijuana and vaping. She has been using Delta 8 as well as mushrooms. It is not clear when she used last. Patient is from another state and her parents had to fly to [place] secondary to her current condition. She admits that she hears voices saying "we are looking for you...where are you ? We can't find you". She denies SI/HI and states "I am very attached to my family". Patient's family reports that this is her first admission. It was reported that patient was medicated with Ativan at ED secondary to increased restlessness and agitations. She received Vistaril from this Clinical research associate. Patient refused her scheduled Cymbalta. There is no family history of psychiatric issues. Patient has two siblings who are supportive and have no mental health/substance use problem. Patient was able to state that she was influenced by her friends at school and used the above drugs. She is tearful, restless and confused at times. Her current diet is vegan. She has no medical issues and was unable to tell her LMP. Skin assessment performed by writer assisted by [facility], RN and no skin concerns noted. Patient was oriented to the unit and safety precautions initiated.

## 2021-01-24 NOTE — BH Assessment (Signed)
Patient is to be admitted to [facility] by Dr. Toni Amend.  Attending Physician will be Dr. Neale Burly.   Patient has been assigned to room 324, by [facility] Charge Nurse [place].    ER staff is aware of the admission: Glenda, ER Secretary   Dr. Derrill Kay, ER MD  Amy, Patient's Nurse  Sue Lush Patient Access.

## 2021-01-24 NOTE — Progress Notes (Addendum)
Patient just briefly lost consciousness and was assisted to the floor by another patient. She was incontinent of urine as she was being assisted to the floor by peer. Peer reports patient's eyes rolled back in her head and she "started to go down" when he caught her. Patient  was assisted back to bed by staff. She had earlier complained of nausea and was given some crackers and ginger ale. She has been tearful and confused, not knowing why she is here since admission. Blood sugar 99, blood pressure 120/72, pulse 94, respirations 16, and o2 sat 100% on room air. Elenore Paddy, PMHNP notified and patient placed on 1:1 observation for safety since patient reports feeling faint and continues to try to walk around. Patient continues to be confused

## 2021-01-24 NOTE — Tx Team (Signed)
Initial Treatment Plan 01/24/2021 7:25 PM [name] DUK:[id number]    PATIENT STRESSORS: Altered thought process Anxiety Substance use   PATIENT STRENGTHS: Communication skills  Physical Health  Supportive family/friends    PATIENT IDENTIFIED PROBLEMS: Altered thought process  Substance use   Anxiety                 DISCHARGE CRITERIA:  Ability to meet basic life and health needs Improved stabilization in mood, thinking, and/or behavior Motivation to continue treatment in a less acute level of care Verbal commitment to aftercare and medication compliance  PRELIMINARY DISCHARGE PLAN: Outpatient therapy Participate in family therapy Return to previous living arrangement Return to previous work or school arrangements  PATIENT/FAMILY INVOLVEMENT: This treatment plan has been presented to and reviewed with the patient, Sandra [name].  The patient has been given the opportunity to ask questions and make suggestions.  Olin Pia, RN 01/24/2021, 7:25 PM

## 2021-01-24 NOTE — ED Notes (Signed)
Pt given breakfast tray

## 2021-01-24 NOTE — ED Notes (Signed)
VS not obtained at this time d/t pt being asleep.  

## 2021-01-24 NOTE — Progress Notes (Signed)
Rapid Response Event Note   Reason for Call : loss of consciousness   Initial Focused Assessment: Pt alert lying in bed. Pt states she feels terrible. VSS. CBG WNL. Pt oriented to self, time, and place. Disoriented to situation. All extremities normal in strength. Pt able to follow all commands. Pt still c/o feeling faint.   Interventions: CBG and VS checked. All stable. Neuro exam performed and was normal. Primary RN states pt is back to baseline as far as how she came into the hospital.   Plan of Care: Pt gotten back into bed. Sitter at bedside. Primary RN notifying provider. Will contact AC if pt is to be admitted to medical.   Event Summary:   MD Notified: [place] Call Time: 2225 Arrival Time: 2230 End Time: 2250  Henrene Dodge, RN

## 2021-01-24 NOTE — ED Notes (Signed)
When this RN attempted to administer medication to patient, patient states "I just dont think I need this right now, I'm just trying to understand the world right now. This isn't what I need. Can I get a copper IUD instead". This RN attempted to educate patient on medication and the reason for giving and attempted to reorient person to situation. Pt did not seem to verbalize understanding to this RN and still making bizarre statements.

## 2021-01-24 NOTE — ED Notes (Addendum)
Pt came out of her rm appearing confused. This tech at pt side to converse with her. Pt stated, "I am confused about the time." I informed pt what time and what day of the week it is. Pt still confused and asked "How does time move?" This tech informed pt that I did not have an answer for her. Pt ambulated out in the hallway appearing like she wanted to walk down the hallway. This tech was able to redirect pt back to her rm. Pt went in her rm and closed the door.

## 2021-01-24 NOTE — ED Notes (Addendum)
Pt taking shower.  

## 2021-01-24 NOTE — BH Assessment (Signed)
Pt  placed  under  IVC PAPERS  PER  DR  CLAPACS  MD  INFORMED  NANCY  KELLY  [facility] MED  SECETARY

## 2021-01-25 ENCOUNTER — Observation Stay: Payer: PRIVATE HEALTH INSURANCE

## 2021-01-25 ENCOUNTER — Inpatient Hospital Stay
Admission: RE | Admit: 2021-01-25 | Discharge: 2021-01-31 | DRG: 312 | Disposition: A | Discharge status: Home / Self Care | Payer: PRIVATE HEALTH INSURANCE | Source: Ambulatory Visit | Attending: Internal Medicine | Admitting: Internal Medicine

## 2021-01-25 ENCOUNTER — Observation Stay (HOSPITAL_COMMUNITY)
Admit: 2021-01-25 | Discharge: 2021-01-25 | Disposition: A | Discharge status: Home / Self Care | Payer: PRIVATE HEALTH INSURANCE | Attending: Family Medicine | Admitting: Family Medicine

## 2021-01-25 DIAGNOSIS — R400 Somnolence: Secondary | ICD-10-CM | POA: Diagnosis present

## 2021-01-25 DIAGNOSIS — Z68.41 Body mass index (BMI) pediatric, less than 5th percentile for age: Secondary | ICD-10-CM | POA: Diagnosis not present

## 2021-01-25 DIAGNOSIS — R443 Hallucinations, unspecified: Secondary | ICD-10-CM | POA: Diagnosis present

## 2021-01-25 DIAGNOSIS — F22 Delusional disorders: Secondary | ICD-10-CM | POA: Diagnosis present

## 2021-01-25 DIAGNOSIS — Z8616 Personal history of COVID-19: Secondary | ICD-10-CM

## 2021-01-25 DIAGNOSIS — R55 Syncope and collapse: Secondary | ICD-10-CM | POA: Diagnosis present

## 2021-01-25 DIAGNOSIS — F39 Unspecified mood [affective] disorder: Secondary | ICD-10-CM | POA: Diagnosis present

## 2021-01-25 DIAGNOSIS — Z888 Allergy status to other drugs, medicaments and biological substances status: Secondary | ICD-10-CM

## 2021-01-25 DIAGNOSIS — F17210 Nicotine dependence, cigarettes, uncomplicated: Secondary | ICD-10-CM | POA: Diagnosis present

## 2021-01-25 DIAGNOSIS — F411 Generalized anxiety disorder: Secondary | ICD-10-CM | POA: Diagnosis present

## 2021-01-25 DIAGNOSIS — F29 Unspecified psychosis not due to a substance or known physiological condition: Secondary | ICD-10-CM

## 2021-01-25 DIAGNOSIS — R32 Unspecified urinary incontinence: Secondary | ICD-10-CM | POA: Diagnosis present

## 2021-01-25 DIAGNOSIS — Z880 Allergy status to penicillin: Secondary | ICD-10-CM | POA: Diagnosis not present

## 2021-01-25 DIAGNOSIS — E440 Moderate protein-calorie malnutrition: Secondary | ICD-10-CM | POA: Diagnosis present

## 2021-01-25 DIAGNOSIS — Z79899 Other long term (current) drug therapy: Secondary | ICD-10-CM

## 2021-01-25 DIAGNOSIS — F23 Brief psychotic disorder: Secondary | ICD-10-CM | POA: Diagnosis present

## 2021-01-25 LAB — CBC
HCT: 37.8 % (ref 36.0–46.0)
Hemoglobin: 13.2 g/dL (ref 12.0–15.0)
MCH: 29.1 pg (ref 26.0–34.0)
MCHC: 34.9 g/dL (ref 30.0–36.0)
MCV: 83.4 fL (ref 80.0–100.0)
Platelets: 271 10*3/uL (ref 150–400)
RBC: 4.53 MIL/uL (ref 3.87–5.11)
RDW: 13.6 % (ref 11.5–15.5)
WBC: 5.7 10*3/uL (ref 4.0–10.5)
nRBC: 0 % (ref 0.0–0.2)

## 2021-01-25 LAB — CBC WITH DIFFERENTIAL/PLATELET
Abs Immature Granulocytes: 0.02 10*3/uL (ref 0.00–0.07)
Basophils Absolute: 0 10*3/uL (ref 0.0–0.1)
Basophils Relative: 1 %
Eosinophils Absolute: 0.1 10*3/uL (ref 0.0–0.5)
Eosinophils Relative: 1 %
HCT: 41 % (ref 36.0–46.0)
Hemoglobin: 14.5 g/dL (ref 12.0–15.0)
Immature Granulocytes: 0 %
Lymphocytes Relative: 22 %
Lymphs Abs: 1.3 10*3/uL (ref 0.7–4.0)
MCH: 29.1 pg (ref 26.0–34.0)
MCHC: 35.4 g/dL (ref 30.0–36.0)
MCV: 82.3 fL (ref 80.0–100.0)
Monocytes Absolute: 0.7 10*3/uL (ref 0.1–1.0)
Monocytes Relative: 11 %
Neutro Abs: 3.9 10*3/uL (ref 1.7–7.7)
Neutrophils Relative %: 65 %
Platelets: 304 10*3/uL (ref 150–400)
RBC: 4.98 MIL/uL (ref 3.87–5.11)
RDW: 13.7 % (ref 11.5–15.5)
WBC: 6 10*3/uL (ref 4.0–10.5)
nRBC: 0 % (ref 0.0–0.2)

## 2021-01-25 LAB — LIPID PANEL
Cholesterol: 116 mg/dL (ref 0–200)
HDL: 66 mg/dL (ref >40)
LDL Cholesterol: 43 mg/dL (ref 0–99)
Total CHOL/HDL Ratio: 1.8 RATIO
Triglycerides: 35 mg/dL (ref <150)
VLDL: 7 mg/dL (ref 0–40)

## 2021-01-25 LAB — BASIC METABOLIC PANEL
Anion gap: 8 (ref 5–15)
BUN: 12 mg/dL (ref 6–20)
CO2: 25 mmol/L (ref 22–32)
Calcium: 9.1 mg/dL (ref 8.9–10.3)
Chloride: 105 mmol/L (ref 98–111)
Creatinine, Ser: 0.58 mg/dL (ref 0.44–1.00)
GFR, Estimated: >60 mL/min (ref >60)
Glucose, Bld: 87 mg/dL (ref 70–99)
Potassium: 3.8 mmol/L (ref 3.5–5.1)
Sodium: 138 mmol/L (ref 135–145)

## 2021-01-25 LAB — HEMOGLOBIN A1C
Hgb A1c MFr Bld: 5 % (ref 4.8–5.6)
Mean Plasma Glucose: 96.8 mg/dL

## 2021-01-25 LAB — COMPREHENSIVE METABOLIC PANEL
ALT: 16 U/L (ref 0–44)
AST: 22 U/L (ref 15–41)
Albumin: 4.5 g/dL (ref 3.5–5.0)
Alkaline Phosphatase: 19 U/L — ABNORMAL LOW (ref 38–126)
Anion gap: 12 (ref 5–15)
BUN: 11 mg/dL (ref 6–20)
CO2: 24 mmol/L (ref 22–32)
Calcium: 9.9 mg/dL (ref 8.9–10.3)
Chloride: 101 mmol/L (ref 98–111)
Creatinine, Ser: 0.66 mg/dL (ref 0.44–1.00)
GFR, Estimated: >60 mL/min (ref >60)
Glucose, Bld: 85 mg/dL (ref 70–99)
Potassium: 3.7 mmol/L (ref 3.5–5.1)
Sodium: 137 mmol/L (ref 135–145)
Total Bilirubin: 2.1 mg/dL — ABNORMAL HIGH (ref 0.3–1.2)
Total Protein: 7.2 g/dL (ref 6.5–8.1)

## 2021-01-25 LAB — TROPONIN I (HIGH SENSITIVITY)
Troponin I (High Sensitivity): <2 ng/L (ref <18)
Troponin I (High Sensitivity): <2 ng/L (ref <18)

## 2021-01-25 LAB — ECHOCARDIOGRAM COMPLETE: S' Lateral: 2.14 cm

## 2021-01-25 LAB — D-DIMER, QUANTITATIVE: D-Dimer, Quant: <0.27 ug/mL-FEU (ref 0.00–0.50)

## 2021-01-25 LAB — HIV ANTIBODY (ROUTINE TESTING W REFLEX): HIV Screen 4th Generation wRfx: NONREACTIVE

## 2021-01-25 LAB — TSH
TSH: 1.973 u[IU]/mL (ref 0.350–4.500)
TSH: 1.628 u[IU]/mL (ref 0.350–4.500)

## 2021-01-25 MED ORDER — MAGNESIUM HYDROXIDE 400 MG/5ML PO SUSP
30.0000 mL | Freq: Every day | ORAL | Status: DC | PRN
  Filled 2021-01-25: qty 30

## 2021-01-25 MED ORDER — SODIUM CHLORIDE 0.9 % IV SOLN: INTRAVENOUS | Status: DC

## 2021-01-25 MED ORDER — ASPIRIN EC 81 MG PO TBEC
81.0000 mg | DELAYED_RELEASE_TABLET | Freq: Every day | ORAL | Status: DC
  Administered 2021-01-25 – 2021-01-27 (×3): 81 mg via ORAL
  Filled 2021-01-25 (×3): qty 1

## 2021-01-25 MED ORDER — ONDANSETRON HCL 4 MG/2ML IJ SOLN: 4.0000 mg | Freq: Four times a day (QID) | INTRAMUSCULAR | Status: DC | PRN

## 2021-01-25 MED ORDER — ENOXAPARIN SODIUM 40 MG/0.4ML IJ SOSY
40.0000 mg | PREFILLED_SYRINGE | Freq: Every day | INTRAMUSCULAR | Status: DC
  Administered 2021-01-28 – 2021-01-29 (×2): 40 mg via SUBCUTANEOUS
  Filled 2021-01-25 (×3): qty 0.4

## 2021-01-25 MED ORDER — RISPERIDONE 1 MG PO TBDP
0.5000 mg | ORAL_TABLET | Freq: Two times a day (BID) | ORAL | Status: DC
  Administered 2021-01-25 – 2021-01-26 (×2): 0.5 mg via ORAL
  Filled 2021-01-25 (×3): qty 0.5

## 2021-01-25 MED ORDER — ACETAMINOPHEN 650 MG RE SUPP: 650.0000 mg | Freq: Four times a day (QID) | RECTAL | Status: DC | PRN

## 2021-01-25 MED ORDER — ACETAMINOPHEN 325 MG PO TABS: 650.0000 mg | ORAL_TABLET | Freq: Four times a day (QID) | ORAL | Status: DC | PRN

## 2021-01-25 MED ORDER — SODIUM CHLORIDE 0.9 % IV SOLN
1.0000 g | INTRAVENOUS | Status: DC
  Administered 2021-01-25 – 2021-01-29 (×5): 1 g via INTRAVENOUS
  Filled 2021-01-25: qty 1
  Filled 2021-01-25 (×4): qty 10

## 2021-01-25 MED ORDER — TRAZODONE HCL 50 MG PO TABS: 25.0000 mg | ORAL_TABLET | Freq: Every evening | ORAL | Status: DC | PRN

## 2021-01-25 MED ORDER — LORAZEPAM 2 MG/ML IJ SOLN
0.5000 mg | INTRAMUSCULAR | Status: DC | PRN
  Administered 2021-01-25: 11:00:00 0.5 mg via INTRAVENOUS
  Filled 2021-01-25: qty 1

## 2021-01-25 MED ORDER — ONDANSETRON HCL 4 MG PO TABS
4.0000 mg | ORAL_TABLET | Freq: Four times a day (QID) | ORAL | Status: DC | PRN
  Administered 2021-01-29: 4 mg via ORAL
  Filled 2021-01-25: qty 1

## 2021-01-25 NOTE — Progress Notes (Signed)
Patient going to EEG. Sitter at bedside.

## 2021-01-25 NOTE — Consult Note (Signed)
NEUROLOGY CONSULTATION NOTE   Date of service: January 25, 2021 Patient Name: Sandra [name] MRN:  [id number] DOB:  2001/04/08 Reason for consult: syncope Requesting physician: Burnadette Pop MD _ _ _   _ __   _ __ _ _  __ __   _ __   __ _  History of Present Illness   This is a 20 yo woman with a longstanding history of vasovagal syncope and recent onset psychosis (within past 30 days), 2nd covid infection 3 weeks ago who was admitted to [facility] yesterday for evaluation for recent onset of psychotic sx in the setting of decreased po intake and social withdrawal. Last night she had a witnessed event when she was walking and became LH then sank to the ground. A peer caught her and assisted her fall and she did not have head trauma. She was transferred to medical ward for further w/u. Head CT without contrast was normal (personal review). EEG was attempted but she was unable to tolerate 2/2 anxiety and procedure was aborted. TTE was noted to be technically difficult 2/2 poor echo windows and lack of patient cooperation but did not show any significant abnormalities on limited study (aortic valve was not well visualized, mitral was normal).   Patient is unable to give comprehensive history. She states she is exhausted, has not slept in 8 days, and that she hears voices coming from the walls and from the TV even when the TV is not on. I had a long discussion with mother Sandra [name] who provided additional history. 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 At the end of August mother (who lives with rest of family in [place]) received a call from daughter in which daughter conveyed unusual enthusiasm for her sorority trainings and generally had expansive/elated mood. In retrospect mother thinks this was unusual for her but at the time did not find it concerning. About a week later she caught COVID for the second time and quarantined in her room. She  remained largely in her room even after the isolation ended and displayed erratic behavior when her housemates did see her resulting in 2 welfare checks and ultimately admission to the hospital under IVC.  Both mother and daughter report patient has past history of multiple episodes of syncope which occur when she sees blood, gets a shot, stands up too fast, or is dehydrated. Patient reports last night's episode felt similar to prior. No seizure history.   ROS   Per HPI; all other systems reviewed and are negative  Past History   Past Medical History:  Diagnosis Date  . Medical history non-contributory    No past surgical history on file. No family history on file. Social History   Socioeconomic History  . Marital status: Single    Spouse name: Not on file  . Number of children: Not on file  . Years of education: Not on file  . Highest education level: Not on file  Occupational History  . Not on file  Tobacco Use  . Smoking status: Some Days    Packs/day: 0.25    Years: 1.00    Pack years: 0.25    Types: Cigarettes  . Smokeless tobacco: Never  Vaping Use  . Vaping Use: Some days  Substance and Sexual Activity  . Alcohol use: Not Currently  . Drug use: Yes    Types: Marijuana    Comment: Mushrooms, Delta 8  .  Sexual activity: Not Currently  Other Topics Concern  . Not on file  Social History Narrative  . Not on file   Social Determinants of Health   Financial Resource Strain: Not on file  Food Insecurity: Not on file  Transportation Needs: Not on file  Physical Activity: Not on file  Stress: Not on file  Social Connections: Not on file   Allergies  Allergen Reactions  . Tobramycin Rash    Had it when a kid   . Amoxicillin Rash    Reports rash    Medications   Medications Prior to Admission  Medication Sig Dispense Refill Last Dose  . DULoxetine (CYMBALTA) 20 MG capsule Take 20 mg by mouth 2 (two) times daily.     Marland Kitchen [facility] 1/35 1-35 MG-MCG tablet  Take 1 tablet by mouth daily.        Vitals   Vitals:   01/25/21 0940 01/25/21 1144 01/25/21 1537 01/25/21 1949  BP: (!) 109/54 (!) 106/59 (!) 109/53 107/65  Pulse: 83 85 100 91  Resp: 16  16 16   Temp: 98.5 F (36.9 C) 97.8 F (36.6 C) 97.9 F (36.6 C) (!) 97.4 F (36.3 C)  SpO2: 100% 100% 100% 100%     There is no height or weight on file to calculate BMI.  Physical Exam   Physical Exam Gen: alert and oriented to self and hospital, declines to answer most questions, states she is exhausted from lack of sleep and hearing voices HEENT: Atraumatic, normocephalic;mucous membranes moist; oropharynx clear, tongue without atrophy or fasciculations. Resp: CTAB, no w/r/r CV: RRR, no m/g/r; nml S1 and S2. 2+ symmetric peripheral pulses.  Neuro: *MS: alert and oriented to self and hospital, declines to answer most questions, states she is exhausted from lack of sleep and hearing voices *Speech: fluid, nondysarthric, able to name and repeat *CN:    I: Deferred   II,III: PERRLA, VFF by confrontation, optic discs sharp   III,IV,VI: EOMI w/o nystagmus, no ptosis   V: Sensation intact from V1 to V3 to LT   VII: Eyelid closure was full.  Smile symmetric.   VIII: Hearing intact to voice   IX,X: Voice normal, palate elevates symmetrically    XI: SCM/trap 5/5 bilat   XII: Tongue protrudes midline, no atrophy or fasciculations   *Motor:   Normal bulk.  No tremor, rigidity or bradykinesia. No pronator drift.    Strength: Dlt Bic Tri WrE WrF FgS Gr HF KnF KnE PlF DoF    Left 5 5 5 5 5 5 5 5 5 5 5 5     Right 5 5 5 5 5 5 5 5 5 5 5 5    *Sensory: Intact to light touch, pinprick, temperature vibration throughout. Symmetric. Propioception intact bilat.  No double-simultaneous extinction.  *Coordination:  FNF intact bilat *Reflexes:  2+ brisk and symmetric throughout without clonus; toes down-going bilat *Gait: deferred per patient request    Labs   CBC:  Recent Labs  Lab 01/22/21 1709  01/25/21 0151 01/25/21 0354  WBC 8.5 6.0 5.7  NEUTROABS 6.5 3.9  --   HGB 15.0 14.5 13.2  HCT 42.5 41.0 37.8  MCV 81.7 82.3 83.4  PLT 370 304 271    Basic Metabolic Panel:  Lab Results  Component Value Date   NA 138 01/25/2021   K 3.8 01/25/2021   CO2 25 01/25/2021   GLUCOSE 87 01/25/2021   BUN 12 01/25/2021   CREATININE 0.58 01/25/2021   CALCIUM 9.1 01/25/2021  GFRNONAA >60 01/25/2021   Lipid Panel:  Lab Results  Component Value Date   LDLCALC 43 01/25/2021   HgbA1c:  Lab Results  Component Value Date   HGBA1C 5.0 01/25/2021   Urine Drug Screen:     Component Value Date/Time   LABOPIA NONE DETECTED 01/22/2021 1724   COCAINSCRNUR NONE DETECTED 01/22/2021 1724   LABBENZ NONE DETECTED 01/22/2021 1724   AMPHETMU NONE DETECTED 01/22/2021 1724   THCU POSITIVE (A) 01/22/2021 1724   LABBARB NONE DETECTED 01/22/2021 1724    Alcohol Level     Component Value Date/Time   ETH <10 01/22/2021 1703     Impression   This is a 20 yo woman with a longstanding history of vasovagal syncope and recent onset psychosis (within past 30 days), 2nd covid infection 3 weeks ago who was admitted to [facility] yesterday for evaluation for recent onset of psychotic sx in the setting of decreased po intake and social withdrawal. Neurology is consulted after episode of syncope without head trauma yesterday evening. Patient has longstanding history of vasovagal syncope likely exacerbated by orthostasis in the setting of decreased po intake. Suspicion for seizure given history is v. low. Patient unable to tolerate EEG but I do not feel that procedure is currently necessary to complete. Given her acute onset of sx I would like to have a MRI brain to r/o structural causes of her acute behavioral change but she will not be able to tolerate it currently. In the meantime a CT with contrast would be a reasonable interval study (CT on admission was wo contrast but was normal). She requested that I return in the  morning to rediscuss this before ordering CT after she has gotten some sleep, which I will do. If she were to have an episode in the future clinically consistent with seizure that in combination with recent onset psychosis would increase suspicion for autoimmune encephalitis but given that her only truly new sx are psychotic I do not feel further workup incl LP is indicated at this time. D/w psychiatry this evening who plans to start risperdal in hopes that she can be stabilized enough to return home with family to [place]. I agree with that plan. She should be referred to neurology upon return to [place] by her PCP there so that she can have a f/u outpatient visit to reassess if she has developed any further neurologic sx or neurologic exam abnormalities that would warrant further w/u at that time.   Recommendations   - Will readdress recommendation for head CT with contrast in AM, and also repeat a detailed neurologic exam when hopefully patient is better able to give best effort - Agree with starting risperdal per psychiatry recommendations - No indication to reorder EEG at this time - F/u outstanding metabolic and infectious labs  Will continue to follow and will update mother Sandra [name] tomorrow per both patient and mother's request.  ______________________________________________________________________   Thank you for the opportunity to take part in the care of this patient. If you have any further questions, please contact the neurology consultation attending.  Signed,  Bing Neighbors, MD [facility] [phone]  If 7pm- 7am, please page neurology on call as listed in AMION.

## 2021-01-25 NOTE — H&P (Addendum)
[place]   PATIENT NAME: Sandra [name]    MR#:  [id number]  DATE OF BIRTH:  April 29, 2001  DATE OF ADMISSION:  01/25/2021  PRIMARY CARE PHYSICIAN: Pcp, No   Patient is coming from: Behavioral care unit  REQUESTING/REFERRING PHYSICIAN: Gillermo Murdoch, NP  CHIEF COMPLAINT:  Passed out  HISTORY OF PRESENT ILLNESS:  Sandra [name] is a 20 y.o. 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  She denies any falls or head injuries.  She was assisted to the floor.  She was incontinent of urine as she was being assisted to the floor by IPR.  Her Peer reported that her eyes rolled back in her head that she started to go down when he caught her.  She has been having nausea earlier and was given some crackers and ginger ale.  She has been tearful and confused not knowing why she is in the behavioral unit.  Her blood glucose was 99 and blood pressure was 120/72 with a heart rate of 94 and respiratory rate of 16 and pulse oximetry 100% on room air.  The patient was fairly somnolent and a very poor historian during my interview.  She was fairly confused.  She denies any previous history of seizures.  She denies any paresthesias or focal muscle weakness.  No dysuria, oliguria, urinary frequency or urgency or flank pain.  Upon arrival to the medical floor her blood pressure was 117/858 with a heart rate of 62 respiratory rate of 16 temperature 97.8 and pulse oximetry 100% on room air.  EKG as reviewed by me : Showed normal sinus rhythm with a rate of 89 with right axis deviation and right atrial enlargement. Imaging: Stat head CT scan without contrast is currently pending.  Stat portable chest x-ray is currently pending.  Stat labs are currently pending.  On 9/10 her CMP was remarkable for an albumin of 5.3 and total protein of 8.2  CBC was within normal.  Influenza antigens and COVID-19 PCR came back negative.  Tylenol level was less than 10 and salicylate less than 7.  UA showed many bacteria with 6-10 WBCs and more than 160 ketones with trace leukocytes.  Urine pregnancy test was negative.  The patient will be admitted to an observation medically monitored bed for further evaluation and management. PAST MEDICAL HISTORY:   Past Medical History:  Diagnosis Date  . Medical history non-contributory   Generalized anxiety disorder Brief reactive psychosis  PAST SURGICAL HISTORY:  No past surgical history on file.  SOCIAL HISTORY:   Social History   Tobacco Use  . Smoking status: Some Days    Packs/day: 0.25    Years: 1.00    Pack years: 0.25    Types: Cigarettes  . Smokeless tobacco: Never  Substance Use Topics  . Alcohol use: Not Currently  She smokes marijuana.  FAMILY HISTORY:  No family history on file.  DRUG ALLERGIES:   Allergies  Allergen Reactions  . Tobramycin Rash    Had it when a kid   . Amoxicillin Rash    Reports rash    REVIEW OF SYSTEMS:   ROS As per history of present illness. All pertinent systems were reviewed above. Constitutional, HEENT, cardiovascular, respiratory, GI, GU, musculoskeletal, neuro, psychiatric, endocrine, integumentary and hematologic systems were reviewed and are otherwise negative/unremarkable except for positive findings mentioned  above in the HPI.  Please note that the patient is a very poor historian due to somnolence and mild confusion. MEDICATIONS AT HOME:   Prior to Admission medications   Medication Sig Start Date End Date Taking? Authorizing Provider  DULoxetine (CYMBALTA) 20 MG capsule Take 20 mg by mouth 2 (two) times daily. 11/25/20   [provider]  [place]ane 1/35 1-35 MG-MCG tablet Take 1 tablet by mouth daily. 10/18/20   [provider]      VITAL SIGNS:  Blood pressure was 117/858 with a heart rate of 62 respiratory rate of  16 temperature 97.8 and pulse oximetry 100% on room air.   PHYSICAL EXAMINATION:  Physical Exam  GENERAL:  20 y.o.-year-old patient lying in the bed with no acute distress.  She is somnolent but arousable and will answer only very few questions. EYES: Pupils equal, round, reactive to light and accommodation. No scleral icterus. Extraocular muscles intact.  HEENT: Head atraumatic, normocephalic. Oropharynx and nasopharynx clear.  NECK:  Supple, no jugular venous distention. No thyroid enlargement, no tenderness.  LUNGS: Normal breath sounds bilaterally, no wheezing, rales,rhonchi or crepitation. No use of accessory muscles of respiration.  CARDIOVASCULAR: Regular rate and rhythm, S1, S2 normal. No murmurs, rubs, or gallops.  ABDOMEN: Soft, nondistended, nontender. Bowel sounds present. No organomegaly or mass.  EXTREMITIES: No pedal edema, cyanosis, or clubbing.  NEUROLOGIC: Cranial nerves II through XII are intact. Muscle strength 5/5 in all extremities. Sensation intact. Gait not checked.  PSYCHIATRIC: The patient is alert and oriented x self and to place and only the year.  No good eye contact.   SKIN: No obvious rash, lesion, or ulcer.   LABORATORY PANEL:   CBC Recent Labs  Lab 01/22/21 1709  WBC 8.5  HGB 15.0  HCT 42.5  PLT 370   ------------------------------------------------------------------------------------------------------------------  Chemistries  Recent Labs  Lab 01/22/21 1703  NA 139  K 4.1  CL 107  CO2 20*  GLUCOSE 88  BUN 10  CREATININE 0.65  CALCIUM 9.9  AST 26  ALT 16  ALKPHOS 23*  BILITOT 2.6*   ------------------------------------------------------------------------------------------------------------------  Cardiac Enzymes No results for input(s): TROPONINI in the last 168 hours. ------------------------------------------------------------------------------------------------------------------  RADIOLOGY:  No results found.    IMPRESSION  AND PLAN:  Active Problems:   Near syncope  1.  Syncope with associated urinary incontinence, rule out seizures. -The patient will be admitted to an observation medically monitored bed. - Will check orthostatics q 12 hours. - Will obtain a bilateral stat head CT scan without contrast especially given urinary incontinence and 2D echo for further assessment. - The patient will be gently hydrated with IV normal saline and monitored for arrhythmias. Differential diagnosis would include neurally mediated syncope, cardiogenic, arrhythmias related,  orthostatic hypotension and less likely hypoglycemia. - We will check TSH level. - Neurology consult to be obtained.  I notified Dr. Selina Cooley about the patient.  2.  Generalized anxiety disorder. - We will continue her Cymbalta. - The patient will be placed on as needed IV Ativan.  3.  Brief reactive psychosis. - We will resume her psychiatric medications and obtain a psychiatry consult by Dr. Toni Amend.  I notified him.  4.  Recent bacteriuria. - I will obtain urine culture and sensitivity. - We will place her for now on IV Rocephin. - It is unclear if the patient truly has no symptoms as she was fairly somnolent and poor historian.    DVT prophylaxis: Lovenox.  Code Status: full code.  Family Communication:  The plan of care was discussed in details with the patient (and with her mother over the phone in details). I answered all questions. The patient agreed to proceed with the above mentioned plan. Further management will depend upon hospital course. Disposition Plan: Back to previous home environment Consults called: Psychiatry, neurology. All the records are reviewed and case discussed with ED provider.  Status is: Observation  The patient remains OBS appropriate and will d/c before 2 midnights.  Dispo: The patient is from:  [facility]              Anticipated d/c is to:  [facility]              Patient currently is not medically stable to d/c.    Difficult to place patient No  TOTAL TIME TAKING CARE OF THIS PATIENT: 50 minutes.    Hannah Beat M.D on 01/25/2021 at 12:33 AM  [facility]   From 7 PM-7 AM, contact night-coverage [email or web address]  CC: Primary care physician; Pcp, No

## 2021-01-25 NOTE — Progress Notes (Signed)
Notified by tele of HR to 140 ST. Very brief and right back to 90.  Will notify MD

## 2021-01-25 NOTE — Progress Notes (Signed)
Patient arrived to 1C-110 from Behavioral health unit. Pt oriented to self, place (hospital) and year. However, during questions her answers were very delayed and required numerous prompting. During orientation and assessment questions, patient stated, "I don't know, I don't know what I'm allowed to say" multiple times. She is very tearful and anxious, requiring lots of consoling and reassurance.   I called patient's mother, Jessamine Barcia to notify her of transfer as well as answered all questions and concerns.

## 2021-01-25 NOTE — Consult Note (Signed)
[facility] Face-to-Face Psychiatry Consult   Reason for Consult: Follow-up consult 20 year old woman with recent onset of psychotic symptoms now on the medical service Referring Physician:  Adhikari Patient Identification: Sandra [name] MRN:  [id number] Principal Diagnosis: <principal problem not specified> Diagnosis:  Active Problems:   Near syncope   Syncope   Total Time spent with patient: 1 hour  Subjective:   Sandra [name] is a 20 y.o. female patient admitted with "I need to rest".  HPI: Patient seen chart reviewed.  20 year old woman seen yesterday in the emergency room for recent onset of psychotic symptoms accompanied by decreased oral intake and dehydration social withdrawal.  Patient was admitted to the psychiatric ward last night but soon after arriving she stood up and had a witnessed fall.  No head injury.  Patient was transferred to medical ward for work-up.  Work-up is underway so far no specific medical pathology identified.  On interview the patient was awake but looked tired and withdrawn.  Stated that she could not rest because the voices were talking to her.  She felt like it never got quiet.  Patient seems still to be somewhat confused and disorganized and paranoid at times but was not hostile and was cooperative with treatment.  History included both a fairly recent case of COVID-19 and by the patient's report use of marijuana and "mushrooms" at school.  She is not the most reliable witness and it is unclear to what degree the drug use was.  Past Psychiatric History: Patient has a past history of some anxiety but no prior treatment for psychosis  Risk to Self:   Risk to Others:   Prior Inpatient Therapy:   Prior Outpatient Therapy:    Past Medical History:  Past Medical History:  Diagnosis Date   Medical history non-contributory    No past surgical history on file. Family History: No family history on file. Family Psychiatric  History: See previous note.  Nothing specific  reported Social History:  Social History   Substance and Sexual Activity  Alcohol Use Not Currently     Social History   Substance and Sexual Activity  Drug Use Yes   Types: Marijuana   Comment: Mushrooms, Delta 8    Social History   Socioeconomic History   Marital status: Single    Spouse name: Not on file   Number of children: Not on file   Years of education: Not on file   Highest education level: Not on file  Occupational History   Not on file  Tobacco Use   Smoking status: Some Days    Packs/day: 0.25    Years: 1.00    Pack years: 0.25    Types: Cigarettes   Smokeless tobacco: Never  Vaping Use   Vaping Use: Some days  Substance and Sexual Activity   Alcohol use: Not Currently   Drug use: Yes    Types: Marijuana    Comment: Mushrooms, Delta 8   Sexual activity: Not Currently  Other Topics Concern   Not on file  Social History Narrative   Not on file   Social Determinants of Health   Financial Resource Strain: Not on file  Food Insecurity: Not on file  Transportation Needs: Not on file  Physical Activity: Not on file  Stress: Not on file  Social Connections: Not on file   Additional Social History:    Allergies:   Allergies  Allergen Reactions   Tobramycin Rash    Had it when a kid  Amoxicillin Rash    Reports rash    Labs:  Results for orders placed or performed during the hospital encounter of 01/25/21 (from the past 48 hour(s))  HIV Antibody (routine testing w rflx)     Status: None   Collection Time: 01/25/21  1:51 AM  Result Value Ref Range   HIV Screen 4th Generation wRfx Non Reactive Non Reactive    Comment: Performed at [facility], [address], [place], [place] [address]  CBC with Differential/Platelet     Status: None   Collection Time: 01/25/21  1:51 AM  Result Value Ref Range   WBC 6.0 4.0 - 10.5 K/uL   RBC 4.98 3.87 - 5.11 MIL/uL   Hemoglobin 14.5 12.0 - 15.0 g/dL   HCT 37.1 06.2 - 69.4 %   MCV 82.3 80.0 -  100.0 fL   MCH 29.1 26.0 - 34.0 pg   MCHC 35.4 30.0 - 36.0 g/dL   RDW 85.4 62.7 - 03.5 %   Platelets 304 150 - 400 K/uL   nRBC 0.0 0.0 - 0.2 %   Neutrophils Relative % 65 %   Neutro Abs 3.9 1.7 - 7.7 K/uL   Lymphocytes Relative 22 %   Lymphs Abs 1.3 0.7 - 4.0 K/uL   Monocytes Relative 11 %   Monocytes Absolute 0.7 0.1 - 1.0 K/uL   Eosinophils Relative 1 %   Eosinophils Absolute 0.1 0.0 - 0.5 K/uL   Basophils Relative 1 %   Basophils Absolute 0.0 0.0 - 0.1 K/uL   Immature Granulocytes 0 %   Abs Immature Granulocytes 0.02 0.00 - 0.07 K/uL    Comment: Performed at [facility], [address] [place], [place] [address]  Comprehensive metabolic panel     Status: Abnormal   Collection Time: 01/25/21  1:51 AM  Result Value Ref Range   Sodium 137 135 - 145 mmol/L   Potassium 3.7 3.5 - 5.1 mmol/L   Chloride 101 98 - 111 mmol/L   CO2 24 22 - 32 mmol/L   Glucose, Bld 85 70 - 99 mg/dL    Comment: Glucose reference range applies only to samples taken after fasting for at least 8 hours.   BUN 11 6 - 20 mg/dL   Creatinine, Ser 1.82 0.44 - 1.00 mg/dL   Calcium 9.9 8.9 - 99.3 mg/dL   Total Protein 7.2 6.5 - 8.1 g/dL   Albumin 4.5 3.5 - 5.0 g/dL   AST 22 15 - 41 U/L   ALT 16 0 - 44 U/L   Alkaline Phosphatase 19 (L) 38 - 126 U/L   Total Bilirubin 2.1 (H) 0.3 - 1.2 mg/dL   GFR, Estimated >71 >69 mL/min    Comment: (NOTE) Calculated using the CKD-EPI Creatinine Equation (2021)    Anion gap 12 5 - 15    Comment: Performed at [facility], [address] [place], [place] [address]  D-dimer, quantitative     Status: None   Collection Time: 01/25/21  1:51 AM  Result Value Ref Range   D-Dimer, Quant <0.27 0.00 - 0.50 ug/mL-FEU    Comment: (NOTE) At the manufacturer cut-off value of 0.5 g/mL FEU, this assay has a negative predictive value of 95-100%.This assay is intended for use in conjunction with a clinical pretest probability (PTP) assessment model to exclude  pulmonary embolism (PE) and deep venous thrombosis (DVT) in outpatients suspected of PE or DVT. Results should be correlated with clinical presentation. Performed at [facility], [phone]  [address] [place], [place] [address]   Troponin I (High Sensitivity)     Status: None   Collection Time: 01/25/21  1:51 AM  Result Value Ref Range   Troponin I (High Sensitivity) <2 <18 ng/L    Comment: (NOTE) Elevated high sensitivity troponin I (hsTnI) values and significant  changes across serial measurements may suggest ACS but many other  chronic and acute conditions are known to elevate hsTnI results.  Refer to the "Links" section for chest pain algorithms and additional  guidance. Performed at [facility], [address] [place], [place] [address]   Basic metabolic panel     Status: None   Collection Time: 01/25/21  3:54 AM  Result Value Ref Range   Sodium 138 135 - 145 mmol/L   Potassium 3.8 3.5 - 5.1 mmol/L   Chloride 105 98 - 111 mmol/L   CO2 25 22 - 32 mmol/L   Glucose, Bld 87 70 - 99 mg/dL    Comment: Glucose reference range applies only to samples taken after fasting for at least 8 hours.   BUN 12 6 - 20 mg/dL   Creatinine, Ser 2.42 0.44 - 1.00 mg/dL   Calcium 9.1 8.9 - 68.3 mg/dL   GFR, Estimated >41 >96 mL/min    Comment: (NOTE) Calculated using the CKD-EPI Creatinine Equation (2021)    Anion gap 8 5 - 15    Comment: Performed at [facility], [address] [place], [place] [address]  CBC     Status: None   Collection Time: 01/25/21  3:54 AM  Result Value Ref Range   WBC 5.7 4.0 - 10.5 K/uL   RBC 4.53 3.87 - 5.11 MIL/uL   Hemoglobin 13.2 12.0 - 15.0 g/dL   HCT 98.9 21.1 - 94.1 %   MCV 83.4 80.0 - 100.0 fL   MCH 29.1 26.0 - 34.0 pg   MCHC 34.9 30.0 - 36.0 g/dL   RDW 74.0 81.4 - 48.1 %   Platelets 271 150 - 400 K/uL   nRBC 0.0 0.0 - 0.2 %    Comment: Performed at [facility], [address], [place], [place] [address]   Troponin I (High Sensitivity)     Status: None   Collection Time: 01/25/21  3:54 AM  Result Value Ref Range   Troponin I (High Sensitivity) <2 <18 ng/L    Comment: (NOTE) Elevated high sensitivity troponin I (hsTnI) values and significant  changes across serial measurements may suggest ACS but many other  chronic and acute conditions are known to elevate hsTnI results.  Refer to the "Links" section for chest pain algorithms and additional  guidance. Performed at [facility], [address] [place], [place] [address]   TSH     Status: None   Collection Time: 01/25/21  3:54 AM  Result Value Ref Range   TSH 1.628 0.350 - 4.500 uIU/mL    Comment: Performed by a 3rd Generation assay with a functional sensitivity of <=0.01 uIU/mL. Performed at [facility], [address] [place], [place] [address]     Current Facility-Administered Medications  Medication Dose Route Frequency Provider Last Rate Last Admin   0.9 %  sodium chloride infusion   Intravenous Continuous Burnadette Pop, MD 75 mL/hr at 01/25/21 1509 Rate Change at 01/25/21 1509   acetaminophen (TYLENOL) tablet 650 mg  650 mg Oral Q6H PRN Mansy, Jan A, MD       Or   acetaminophen (TYLENOL) suppository 650 mg  650 mg Rectal Q6H PRN Mansy, Vernetta Honey, MD       aspirin EC tablet 81 mg  81 mg Oral Daily Mansy, Jan A, MD   81 mg at 01/25/21 0934   cefTRIAXone (ROCEPHIN) 1 g in sodium chloride 0.9 % 100 mL IVPB  1 g Intravenous Q24H Mansy, Jan A, MD   Stopping Infusion hung by another clincian at 01/25/21 1509   enoxaparin (LOVENOX) injection 40 mg  40 mg Subcutaneous QHS Mansy, Jan A, MD       LORazepam (ATIVAN) injection 0.5 mg  0.5 mg Intravenous Q4H PRN Mansy, Jan A, MD   0.5 mg at 01/25/21 1054   magnesium hydroxide (MILK OF MAGNESIA) suspension 30 mL  30 mL Oral Daily PRN Mansy, Jan A, MD       ondansetron [facility]) tablet 4 mg  4 mg Oral Q6H PRN Mansy, Jan A, MD       Or   ondansetron [facility]) injection 4 mg   4 mg Intravenous Q6H PRN Mansy, Jan A, MD       risperiDONE (RISPERDAL M-TABS) disintegrating tablet 0.5 mg  0.5 mg Oral BID [place] Karl, Jackquline Denmark, MD       traZODone (DESYREL) tablet 25 mg  25 mg Oral QHS PRN Mansy, Vernetta Honey, MD        Musculoskeletal: Strength & Muscle Tone: within normal limits Gait & Station: normal Patient leans: N/A            Psychiatric Specialty Exam:  Presentation  General Appearance: Appropriate for Environment; Casual  Eye Contact:Fair  Speech:Blocked  Speech Volume:Normal  Handedness:Right   Mood and Affect  Mood: No data recorded Affect:Inappropriate; Full Range   Thought Process  Thought Processes:Disorganized  Descriptions of Associations:Loose  Orientation:Full (Time, Place and Person)  Thought Content:Illogical  History of Schizophrenia/Schizoaffective disorder:No  Duration of Psychotic Symptoms:N/A  Hallucinations:No data recorded Ideas of Reference:None  Suicidal Thoughts:No data recorded Homicidal Thoughts:No data recorded  Sensorium  Memory:Immediate Fair; Recent Poor  Judgment:Impaired  Insight:Lacking   Executive Functions  Concentration:Poor  Attention Span:Poor  Recall:Poor  Fund of Knowledge:Poor  Language:Poor   Psychomotor Activity  Psychomotor Activity: No data recorded  Assets  Assets:Communication Skills; Financial Resources/Insurance; Housing; Vocational/Educational; Social Support   Sleep  Sleep: No data recorded  Physical Exam: Physical Exam Vitals and nursing note reviewed.  Constitutional:      Appearance: Normal appearance.  HENT:     Head: Normocephalic and atraumatic.     Mouth/Throat:     Pharynx: Oropharynx is clear.  Eyes:     Pupils: Pupils are equal, round, and reactive to light.  Cardiovascular:     Rate and Rhythm: Normal rate and regular rhythm.  Pulmonary:     Effort: Pulmonary effort is normal.     Breath sounds: Normal breath sounds.  Abdominal:      General: Abdomen is flat.     Palpations: Abdomen is soft.  Musculoskeletal:        General: Normal range of motion.  Skin:    General: Skin is warm and dry.  Neurological:     General: No focal deficit present.     Mental Status: She is alert. Mental status is at baseline.  Psychiatric:        Attention and Perception: She is inattentive.        Mood and Affect: Affect is blunt.        Speech: Speech is delayed and tangential.  Behavior: Behavior is slowed.        Thought Content: Thought content is paranoid.        Cognition and Memory: Cognition is impaired. Memory is impaired.   Review of Systems  Constitutional:  Positive for malaise/fatigue.  HENT: Negative.    Eyes: Negative.   Respiratory: Negative.    Cardiovascular: Negative.   Gastrointestinal: Negative.   Musculoskeletal: Negative.   Skin: Negative.   Neurological: Negative.   Psychiatric/Behavioral:  Positive for hallucinations and memory loss. Negative for depression, substance abuse and suicidal ideas. The patient has insomnia. The patient is not nervous/anxious.   Blood pressure (!) 106/59, pulse 85, temperature 97.8 F (36.6 C), resp. rate 16, SpO2 100 %. There is no height or weight on file to calculate BMI.  Treatment Plan Summary: Medication management and Plan 20 year old with new onset psychotic symptoms.  Usual differential diagnosis of schizophreniform disorder, psychotic depression, reactive psychosis, substance-induced psychosis or other medical related condition.  Patient is still on the medical service completing work-up.  If she is ambulatory and medically stable the presumed plan would be for transfer back to the psychiatry ward.  In conversation with mother this afternoon the family is unhappy about the lack of visitation on the inpatient psychiatry unit.  I explained to her that this was a situation we had been dealing with for quite a while and there was not going to be any change in the  procedure in the immediate future.  Family seems to prefer having the patient be treated on the medical service.  For now we can table this issue.  I propose starting antipsychotic medication the sooner that we start the sooner the voices may get better.  Proposed Risperdal 0.5 mg twice daily.  Prescription entered in the computer.  Family and patient aware.  We will continue to follow regularly.  Disposition: Recommend psychiatric Inpatient admission when medically cleared. Supportive therapy provided about ongoing stressors.  Mordecai Rasmussen, MD 01/25/2021 3:33 PM

## 2021-01-25 NOTE — Progress Notes (Signed)
Brief same day note:  Patient is a 20 year old female with history of GAD, psychosis, substance abuse who was directly admitted from psychiatric unit for evaluation of acute onset of syncope.  No history of fall or head injury.  She was noted to be incontinent of urine.  Staff reported that her eyes rolled back in her head.  On presentation she was hemodynamically stable.  Patient was admitted for the syncopal work-up/possible seizure.  CT head did not show any acute intracranial abnormalities.  Chest x-ray showed no intrathoracic abnormality. Neurology has been consulted and following.  Echo has been ordered.  I have requested RN to check her orthostatics vitals.  TSH is normal.. Psychiatry is also following. Her UA is not impressive for UTI ,unclear if patient has dysuria because she is a very poor historian.  Admitting physician has started her on Rocephin .  We will get urine culture report.  She does not have fever or leukocytosis. We are trying to get an EEG for her. I briefly discussed with her mom on the hallway this morning. Patient seen and examined the bedside today.  She is hemodynamically stable.  She was alert and oriented but looked weak, lying in bed, did not want to talk much.  Examination was grossly normal. Will continue syncopal work-up/get report of EEG/follow-up with neurology.  Goal is to discharge back to inpatient psychiatric unit.

## 2021-01-25 NOTE — Progress Notes (Signed)
*  PRELIMINARY RESULTS* Echocardiogram 2D Echocardiogram has been performed.  Sandra [name] 01/25/2021, 1:04 PM

## 2021-01-26 ENCOUNTER — Inpatient Hospital Stay: Payer: PRIVATE HEALTH INSURANCE

## 2021-01-26 LAB — IRON AND TIBC
Iron: 63 ug/dL (ref 28–170)
Saturation Ratios: 23 % (ref 10.4–31.8)
TIBC: 270 ug/dL (ref 250–450)
UIBC: 207 ug/dL

## 2021-01-26 LAB — MAGNESIUM: Magnesium: 1.8 mg/dL (ref 1.7–2.4)

## 2021-01-26 LAB — VITAMIN D 25 HYDROXY (VIT D DEFICIENCY, FRACTURES): Vit D, 25-Hydroxy: 30.94 ng/mL (ref 30–100)

## 2021-01-26 LAB — VITAMIN B12: Vitamin B-12: 519 pg/mL (ref 180–914)

## 2021-01-26 LAB — FOLATE: Folate: 7.1 ng/mL (ref >5.9)

## 2021-01-26 LAB — PHOSPHORUS: Phosphorus: 3.1 mg/dL (ref 2.5–4.6)

## 2021-01-26 MED ORDER — IOHEXOL 350 MG/ML SOLN
75.0000 mL | Freq: Once | INTRAVENOUS | Status: AC | PRN
  Administered 2021-01-26: 19:00:00 75 mL via INTRAVENOUS

## 2021-01-26 MED ORDER — ENSURE ENLIVE PO LIQD
237.0000 mL | Freq: Three times a day (TID) | ORAL | Status: DC
  Administered 2021-01-26 – 2021-01-31 (×14): 237 mL via ORAL

## 2021-01-26 MED ORDER — ADULT MULTIVITAMIN W/MINERALS CH
1.0000 | ORAL_TABLET | Freq: Every day | ORAL | Status: DC
  Administered 2021-01-26 – 2021-01-28 (×3): 1 via ORAL
  Filled 2021-01-26 (×3): qty 1

## 2021-01-26 MED ORDER — RISPERIDONE 1 MG PO TBDP
1.0000 mg | ORAL_TABLET | Freq: Two times a day (BID) | ORAL | Status: DC
  Administered 2021-01-26 – 2021-01-31 (×10): 1 mg via ORAL
  Filled 2021-01-26 (×11): qty 1

## 2021-01-26 MED ORDER — LORAZEPAM 2 MG/ML IJ SOLN
1.0000 mg | Freq: Once | INTRAMUSCULAR | Status: DC | PRN
Start: 2021-01-26 — End: 2021-01-31

## 2021-01-26 NOTE — Progress Notes (Signed)
Patient amenable to CT head w contrast. Will order now with ativan for anxiety prior to imaging.  Bing Neighbors, MD [facility] [phone]  If 7pm- 7am, please page neurology on call as listed in AMION.

## 2021-01-26 NOTE — Progress Notes (Signed)
[facility] Progress Note  Patient: Sandra [name]    ZTI:[id number]  DOA: 01/25/2021     Date of Service: the patient was seen and examined on 01/26/2021  No chief complaint on file.  Brief hospital course: Sandra [name] is a 20 y.o. 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  She denies any falls or head injuries.  She was assisted to the floor.  She was incontinent of urine as she was being assisted to the floor by IPR.  Her Peer reported that her eyes rolled back in her head that she started to go down when he caught her.  She has been having nausea earlier and was given some crackers and ginger ale.  She has been tearful and confused not knowing why she is in the behavioral unit.  Her blood glucose was 99 and blood pressure was 120/72 with a heart rate of 94 and respiratory rate of 16 and pulse oximetry 100% on room air.  The patient was fairly somnolent and a very poor historian during my interview.  She was fairly confused.  She denies any previous history of seizures.  She denies any paresthesias or focal muscle weakness.  No dysuria, oliguria, urinary frequency or urgency or flank pain.  Upon arrival to the medical floor her blood pressure was 117/858 with a heart rate of 62 respiratory rate of 16 temperature 97.8 and pulse oximetry 100% on room air.   EKG as reviewed by me : Showed normal sinus rhythm with a rate of 89 with right axis deviation and right atrial enlargement. Imaging: Stat head CT scan without contrast is currently pending.  Stat portable chest x-ray is currently pending.  Stat labs are currently pending.  On 9/10 her CMP was remarkable for an albumin of 5.3 and total protein of 8.2 CBC was within normal.  Influenza antigens and COVID-19 PCR came back negative.  Tylenol level was less than 10 and  salicylate less than 7.  UA showed many bacteria with 6-10 WBCs and more than 160 ketones with trace leukocytes.  Urine pregnancy test was negative.  The patient will be admitted to an observation medically monitored bed for further evaluation and management.   Assessment and Plan: Active Problems:   Near syncope   # Syncope with associated urinary incontinence, rule out seizures. -The patient will be admitted to an observation medically monitored bed. - Orthostatics positive, blood pressure dropped but patient remained asymptomatic --TTE shows LVEF 55%, no any other significant findings --CT head no acute findings --Continue IV fluid for hydration, continue monitor on telemetry for arrhythmias.   -- Patient was seen by neurology, recommended no EEG at this time. - TSH level 1.6 wnl Neurology altered CT head with and without contrast, follow neurology for further recommendation   # Generalized anxiety disorder. - We will continue her Cymbalta. - The patient will be placed on as needed IV Ativan.   #  Brief reactive psychosis. - We will resume her psychiatric medications and obtain a psychiatry consult by Dr. Toni Amend.  #  Recent bacteriuria. - I will obtain urine culture and sensitivity. - We will place her for now on IV Rocephin. - It is unclear if the patient truly has no symptoms as she was fairly somnolent and poor historian.    Diet: Regular diet DVT Prophylaxis: Subcutaneous Lovenox   Advance  goals of care discussion: Full code  Family Communication: family was NOT present at bedside, at the time of interview.  The pt provided permission to discuss medical plan with the family. Opportunity was given to ask question and all questions were answered satisfactorily.   Disposition:  Pt is from psych unit, admitted with syncopal episode, patient has orthostatic hypotension, on IV fluid for hydration, which precludes a safe discharge. Discharge to psych unit, when  orthostatics vital signs will improve.  Most likely tomorrow a.m.  Subjective: No significant overnight issues, patient still seems to be confused, denies any other active issues.  Patient was advised to ambulate and walk around in the hallway if she wants with assistance.   Physical Exam: General:  alert oriented to time, place, and person.  Appear in mild distress, affect depressed Eyes: PERRLA ENT: Oral Mucosa Clear, moist  Neck: no JVD,  Cardiovascular: S1 and S2 Present, no Murmur,  Respiratory: good respiratory effort, Bilateral Air entry equal and Decreased, no Crackles, no wheezes Abdomen: Bowel Sound present, Soft and no tenderness,  Skin: no rashes Extremities: no Pedal edema, no calf tenderness Neurologic: without any new focal findings Gait not checked due to patient safety concerns  Vitals:   01/25/21 2325 01/26/21 0803 01/26/21 1127 01/26/21 1129  BP: 101/62 118/77 115/61 98/68  Pulse: 73 (!) 107 74 (!) 106  Resp: 16 20 18    Temp: 97.8 F (36.6 C) 97.8 F (36.6 C) 97.8 F (36.6 C)   TempSrc:  Oral Oral   SpO2: 100% 100% 100% 100%    Intake/Output Summary (Last 24 hours) at 01/26/2021 1518 Last data filed at 01/26/2021 1146 Gross per 24 hour  Intake 1364.39 ml  Output 1850 ml  Net -485.61 ml   There were no vitals filed for this visit.  Data Reviewed: I have personally reviewed and interpreted daily labs, tele strips, imagings as discussed above. I reviewed all nursing notes, pharmacy notes, vitals, pertinent old records I have discussed plan of care as described above with RN and patient/family.  CBC: Recent Labs  Lab 01/22/21 1709 01/25/21 0151 01/25/21 0354  WBC 8.5 6.0 5.7  NEUTROABS 6.5 3.9  --   HGB 15.0 14.5 13.2  HCT 42.5 41.0 37.8  MCV 81.7 82.3 83.4  PLT 370 304 271   Basic Metabolic Panel: Recent Labs  Lab 01/22/21 1703 01/25/21 0151 01/25/21 0354 01/26/21 1000  NA 139 137 138  --   K 4.1 3.7 3.8  --   CL 107 101 105  --   CO2  20* 24 25  --   GLUCOSE 88 85 87  --   BUN 10 11 12   --   CREATININE 0.65 0.66 0.58  --   CALCIUM 9.9 9.9 9.1  --   MG  --   --   --  1.8  PHOS  --   --   --  3.1    Studies: No results found.  Scheduled Meds:  aspirin EC  81 mg Oral Daily   enoxaparin (LOVENOX) injection  40 mg Subcutaneous QHS   feeding supplement  237 mL Oral TID BM   multivitamin with minerals  1 tablet Oral Daily   risperiDONE  0.5 mg Oral BID   Continuous Infusions:  sodium chloride 100 mL/hr at 01/26/21 1441   cefTRIAXone (ROCEPHIN)  IV 1 g (01/26/21 0456)   PRN Meds: acetaminophen **OR** acetaminophen, LORazepam, LORazepam, magnesium hydroxide, ondansetron **OR** ondansetron (ZOFRAN) IV, traZODone  Time spent: 35  minutes  Author: Gillis Santa. MD [facility] 01/26/2021 3:18 PM  To reach On-call, see care teams to locate the attending and reach out to them via [email or web address]. If 7PM-7AM, please contact night-coverage If you still have difficulty reaching the attending provider, please page the [facility] (Director on Call) for [facility] on amion for assistance.

## 2021-01-26 NOTE — Progress Notes (Signed)
Initial Nutrition Assessment  DOCUMENTATION CODES:  Non-severe (moderate) malnutrition in context of social or environmental circumstances, Underweight  INTERVENTION:  Continue current diet as ordered.  Magic cup TID with meals, each supplement provides 290 kcal and 9 grams of protein Ensure Enlive po TID, each supplement provides 350 kcal and 20 grams of protein MVI with minerals  NUTRITION DIAGNOSIS:  Moderate Malnutrition related to social / environmental circumstances (psychiatric illness) as evidenced by mild muscle depletion, mild fat depletion.  GOAL:  Patient will meet greater than or equal to 90% of their needs  MONITOR:  PO intake, Supplement acceptance  REASON FOR ASSESSMENT:  Malnutrition Screening Tool    ASSESSMENT:  20 year old brought to ED with new onset psychiatric symptoms from her [employer]. Initially admitted to behavioral health unit but moved to inpatient unit after a near syncopal episode.  Pt resting in bed eating lunch at the time of statement. Pt unable to provide a nutrition hx, states she has not been eating well but can not say how long this has been going on. Pt at the verge of tears at several points during conversation and made bizarre statements. Is agreeable to ensure supplements. Deficits seen in muscle and fat stores indicating prolonged poor nutrition.     Nutritionally Relevant Medications: Scheduled Meds:  feeding supplement  237 mL Oral TID BM   multivitamin with minerals  1 tablet Oral Daily   Continuous Infusions:  sodium chloride 75 mL/hr at 01/26/21 0209   cefTRIAXone (ROCEPHIN)  IV 1 g (01/26/21 0456)   PRN Meds: magnesium hydroxide, ondansetron  Labs Reviewed  NUTRITION - FOCUSED PHYSICAL EXAM: Flowsheet Row Most Recent Value  Orbital Region No depletion  Upper Arm Region Mild depletion  Thoracic and Lumbar Region Mild depletion  Buccal Region No depletion  Temple Region Mild depletion  Clavicle Bone Region Moderate  depletion  Clavicle and Acromion Bone Region Moderate depletion  Scapular Bone Region Mild depletion  Dorsal Hand No depletion  Patellar Region No depletion  Anterior Thigh Region No depletion  Posterior Calf Region No depletion  Edema (RD Assessment) None  Hair Reviewed  Eyes Reviewed  Mouth Reviewed  Skin Reviewed  Nails Reviewed   Diet Order:   Diet Order             Diet regular Room service appropriate? Yes; Fluid consistency: Thin  Diet effective now                   EDUCATION NEEDS:  No education needs have been identified at this time  Skin:  Skin Assessment: Reviewed RN Assessment  Last BM:  PTA  Height:  Ht Readings from Last 1 Encounters:  01/24/21 5' 5.5" (1.664 m) (68 %, Z= 0.48)*   * Growth percentiles are based on CDC (Girls, 2-20 Years) data.   Weight:  Wt Readings from Last 1 Encounters:  01/24/21 48.5 kg (11 %, Z= -1.21)*   * Growth percentiles are based on CDC (Girls, 2-20 Years) data.    Ideal Body Weight:  54.5 kg  BMI:  There is no height or weight on file to calculate BMI.  Estimated Nutritional Needs:  Kcal:  1700-1900 kcal/d Protein:  85-100 g/d Fluid:  >2L/d  Greig Castilla, RD, LDN Clinical Dietitian Pager on Amion

## 2021-01-26 NOTE — Consult Note (Signed)
[facility] Face-to-Face Psychiatry Consult   Reason for Consult: Consult follow-up 20 year old with new onset psychosis Referring Physician: Lucianne Muss Patient Identification: Sandra [name] MRN:  [id number] Principal Diagnosis: Psychosis (HCC) Diagnosis:  Principal Problem:   Psychosis (HCC) Active Problems:   Near syncope   Syncope   Total Time spent with patient: 30 minutes  Subjective:   Sandra [name] is a 20 y.o. female patient admitted with "I want to go home".  HPI: Patient seen chart reviewed.  Spoke with mother as well.  4 year old woman with recent onset hallucinations paranoia and psychotic symptoms.  Has been on the medical service because of a syncopal episode on admission to psychiatry.  Work-up for syncope so far unrevealing.  Patient has been eating and drinking more and receiving IV fluid.  On exam today she was sitting up in bed intermittently tearful staring into the mirror.  Acknowledges still hearing auditory hallucinations and feeling very confused.  Examined patient there was no sign of any cogwheeling or stiffness.  No complaints of any side effects.  Has so far been compliant with initial dose of Risperdal.  Reviewed situation with mother as well  Past Psychiatric History: History of past anxiety treated as an outpatient no previous psychosis or hospitalization  Risk to Self:   Risk to Others:   Prior Inpatient Therapy:   Prior Outpatient Therapy:    Past Medical History:  Past Medical History:  Diagnosis Date   Medical history non-contributory    No past surgical history on file. Family History: No family history on file. Family Psychiatric  History: See previous Social History:  Social History   Substance and Sexual Activity  Alcohol Use Not Currently     Social History   Substance and Sexual Activity  Drug Use Yes   Types: Marijuana   Comment: Mushrooms, Delta 8    Social History   Socioeconomic History   Marital status: Single    Spouse name: Not on  file   Number of children: Not on file   Years of education: Not on file   Highest education level: Not on file  Occupational History   Not on file  Tobacco Use   Smoking status: Some Days    Packs/day: 0.25    Years: 1.00    Pack years: 0.25    Types: Cigarettes   Smokeless tobacco: Never  Vaping Use   Vaping Use: Some days  Substance and Sexual Activity   Alcohol use: Not Currently   Drug use: Yes    Types: Marijuana    Comment: Mushrooms, Delta 8   Sexual activity: Not Currently  Other Topics Concern   Not on file  Social History Narrative   Not on file   Social Determinants of Health   Financial Resource Strain: Not on file  Food Insecurity: Not on file  Transportation Needs: Not on file  Physical Activity: Not on file  Stress: Not on file  Social Connections: Not on file   Additional Social History:    Allergies:   Allergies  Allergen Reactions   Tobramycin Rash    Had it when a kid    Amoxicillin Rash    Reports rash    Labs:  Results for orders placed or performed during the hospital encounter of 01/25/21 (from the past 48 hour(s))  HIV Antibody (routine testing w rflx)     Status: None   Collection Time: 01/25/21  1:51 AM  Result Value Ref Range   HIV Screen 4th Generation wRfx  Non Reactive Non Reactive    Comment: Performed at [facility], [address], [place], [place] [address]  CBC with Differential/Platelet     Status: None   Collection Time: 01/25/21  1:51 AM  Result Value Ref Range   WBC 6.0 4.0 - 10.5 K/uL   RBC 4.98 3.87 - 5.11 MIL/uL   Hemoglobin 14.5 12.0 - 15.0 g/dL   HCT 74.1 28.7 - 86.7 %   MCV 82.3 80.0 - 100.0 fL   MCH 29.1 26.0 - 34.0 pg   MCHC 35.4 30.0 - 36.0 g/dL   RDW 67.2 09.4 - 70.9 %   Platelets 304 150 - 400 K/uL   nRBC 0.0 0.0 - 0.2 %   Neutrophils Relative % 65 %   Neutro Abs 3.9 1.7 - 7.7 K/uL   Lymphocytes Relative 22 %   Lymphs Abs 1.3 0.7 - 4.0 K/uL   Monocytes Relative 11 %   Monocytes Absolute  0.7 0.1 - 1.0 K/uL   Eosinophils Relative 1 %   Eosinophils Absolute 0.1 0.0 - 0.5 K/uL   Basophils Relative 1 %   Basophils Absolute 0.0 0.0 - 0.1 K/uL   Immature Granulocytes 0 %   Abs Immature Granulocytes 0.02 0.00 - 0.07 K/uL    Comment: Performed at [facility], [address] [place], [place] [address]  Comprehensive metabolic panel     Status: Abnormal   Collection Time: 01/25/21  1:51 AM  Result Value Ref Range   Sodium 137 135 - 145 mmol/L   Potassium 3.7 3.5 - 5.1 mmol/L   Chloride 101 98 - 111 mmol/L   CO2 24 22 - 32 mmol/L   Glucose, Bld 85 70 - 99 mg/dL    Comment: Glucose reference range applies only to samples taken after fasting for at least 8 hours.   BUN 11 6 - 20 mg/dL   Creatinine, Ser 6.29 0.44 - 1.00 mg/dL   Calcium 9.9 8.9 - 47.6 mg/dL   Total Protein 7.2 6.5 - 8.1 g/dL   Albumin 4.5 3.5 - 5.0 g/dL   AST 22 15 - 41 U/L   ALT 16 0 - 44 U/L   Alkaline Phosphatase 19 (L) 38 - 126 U/L   Total Bilirubin 2.1 (H) 0.3 - 1.2 mg/dL   GFR, Estimated >54 >65 mL/min    Comment: (NOTE) Calculated using the CKD-EPI Creatinine Equation (2021)    Anion gap 12 5 - 15    Comment: Performed at [facility], [address] [place], [place] [address]  D-dimer, quantitative     Status: None   Collection Time: 01/25/21  1:51 AM  Result Value Ref Range   D-Dimer, Quant <0.27 0.00 - 0.50 ug/mL-FEU    Comment: (NOTE) At the manufacturer cut-off value of 0.5 g/mL FEU, this assay has a negative predictive value of 95-100%.This assay is intended for use in conjunction with a clinical pretest probability (PTP) assessment model to exclude pulmonary embolism (PE) and deep venous thrombosis (DVT) in outpatients suspected of PE or DVT. Results should be correlated with clinical presentation. Performed at [facility], [address] [place], [place] [address]   Troponin I (High Sensitivity)     Status: None   Collection Time: 01/25/21  1:51 AM   Result Value Ref Range   Troponin I (High Sensitivity) <2 <18 ng/L    Comment: (NOTE) Elevated high sensitivity troponin I (hsTnI) values and significant  changes across serial measurements may suggest ACS  but many other  chronic and acute conditions are known to elevate hsTnI results.  Refer to the "Links" section for chest pain algorithms and additional  guidance. Performed at [facility], [address] [place], [place] [address]   Basic metabolic panel     Status: None   Collection Time: 01/25/21  3:54 AM  Result Value Ref Range   Sodium 138 135 - 145 mmol/L   Potassium 3.8 3.5 - 5.1 mmol/L   Chloride 105 98 - 111 mmol/L   CO2 25 22 - 32 mmol/L   Glucose, Bld 87 70 - 99 mg/dL    Comment: Glucose reference range applies only to samples taken after fasting for at least 8 hours.   BUN 12 6 - 20 mg/dL   Creatinine, Ser 6.04 0.44 - 1.00 mg/dL   Calcium 9.1 8.9 - 54.0 mg/dL   GFR, Estimated >98 >11 mL/min    Comment: (NOTE) Calculated using the CKD-EPI Creatinine Equation (2021)    Anion gap 8 5 - 15    Comment: Performed at [facility], [address] [place], [place] [address]  CBC     Status: None   Collection Time: 01/25/21  3:54 AM  Result Value Ref Range   WBC 5.7 4.0 - 10.5 K/uL   RBC 4.53 3.87 - 5.11 MIL/uL   Hemoglobin 13.2 12.0 - 15.0 g/dL   HCT 29.5 62.1 - 30.8 %   MCV 83.4 80.0 - 100.0 fL   MCH 29.1 26.0 - 34.0 pg   MCHC 34.9 30.0 - 36.0 g/dL   RDW 65.7 84.6 - 96.2 %   Platelets 271 150 - 400 K/uL   nRBC 0.0 0.0 - 0.2 %    Comment: Performed at [facility], [address], [place], [place] [address]  Troponin I (High Sensitivity)     Status: None   Collection Time: 01/25/21  3:54 AM  Result Value Ref Range   Troponin I (High Sensitivity) <2 <18 ng/L    Comment: (NOTE) Elevated high sensitivity troponin I (hsTnI) values and significant  changes across serial measurements may suggest ACS but many other  chronic and acute  conditions are known to elevate hsTnI results.  Refer to the "Links" section for chest pain algorithms and additional  guidance. Performed at [facility], [address] [place], [place] [address]   TSH     Status: None   Collection Time: 01/25/21  3:54 AM  Result Value Ref Range   TSH 1.628 0.350 - 4.500 uIU/mL    Comment: Performed by a 3rd Generation assay with a functional sensitivity of <=0.01 uIU/mL. Performed at [facility], [address] [place], [place] [address]   Vitamin B12     Status: None   Collection Time: 01/26/21 10:00 AM  Result Value Ref Range   Vitamin B-12 519 180 - 914 pg/mL    Comment: (NOTE) This assay is not validated for testing neonatal or myeloproliferative syndrome specimens for Vitamin B12 levels. Performed at [facility], [address] [place], [place] [address]   VITAMIN D 25 Hydroxy (Vit-D Deficiency, Fractures)     Status: None   Collection Time: 01/26/21 10:00 AM  Result Value Ref Range   Vit D, 25-Hydroxy 30.94 30 - 100 ng/mL    Comment: (NOTE) Vitamin D deficiency has been defined by the Institute of Medicine  and an Endocrine Society practice guideline as a level of serum 25-OH  vitamin D less  than 20 ng/mL (1,2). The Endocrine Society went on to  further define vitamin D insufficiency as a level between 21 and 29  ng/mL (2).  1. IOM (Institute of Medicine). 2010. Dietary reference intakes for  calcium and D. [place]: [employer]. 2. Holick MF, Binkley [place], Bischoff-Ferrari HA, et al. Evaluation,  treatment, and prevention of vitamin D deficiency: an Endocrine  Society clinical practice guideline, JCEM. 2011 Jul; 96(7): 1911-30.  Performed at [facility], [address] [place], [place] [address]   Iron and TIBC     Status: None   Collection Time: 01/26/21 10:00 AM  Result Value Ref Range   Iron 63 28 - 170 ug/dL   TIBC 101 751 - 025 ug/dL   Saturation Ratios 23 10.4 -  31.8 %   UIBC 207 ug/dL    Comment: Performed at [facility], [address] [place], [place] [address]  Folate     Status: None   Collection Time: 01/26/21 10:00 AM  Result Value Ref Range   Folate 7.1 >5.9 ng/mL    Comment: Performed at [facility], [address], [place], [place] [address]  Magnesium     Status: None   Collection Time: 01/26/21 10:00 AM  Result Value Ref Range   Magnesium 1.8 1.7 - 2.4 mg/dL    Comment: Performed at [facility], [address] [place], [place] [address]  Phosphorus     Status: None   Collection Time: 01/26/21 10:00 AM  Result Value Ref Range   Phosphorus 3.1 2.5 - 4.6 mg/dL    Comment: Performed at [facility], [address] [place], [place] [address]    Current Facility-Administered Medications  Medication Dose Route Frequency Provider Last Rate Last Admin   0.9 %  sodium chloride infusion   Intravenous Continuous Gillis Santa, MD 100 mL/hr at 01/26/21 1441 Rate Change at 01/26/21 1441   acetaminophen (TYLENOL) tablet 650 mg  650 mg Oral Q6H PRN Mansy, Jan A, MD       Or   acetaminophen (TYLENOL) suppository 650 mg  650 mg Rectal Q6H PRN Mansy, Jan A, MD       aspirin EC tablet 81 mg  81 mg Oral Daily Mansy, Jan A, MD   81 mg at 01/26/21 1119   cefTRIAXone (ROCEPHIN) 1 g in sodium chloride 0.9 % 100 mL IVPB  1 g Intravenous Q24H Mansy, Jan A, MD 200 mL/hr at 01/26/21 0456 1 g at 01/26/21 0456   enoxaparin (LOVENOX) injection 40 mg  40 mg Subcutaneous QHS Mansy, Jan A, MD       feeding supplement (ENSURE ENLIVE / ENSURE PLUS) liquid 237 mL  237 mL Oral TID BM Gillis Santa, MD   237 mL at 01/26/21 1520   LORazepam (ATIVAN) injection 0.5 mg  0.5 mg Intravenous Q4H PRN Mansy, Jan A, MD   0.5 mg at 01/25/21 1054   LORazepam (ATIVAN) injection 1 mg  1 mg Intravenous Once PRN Jefferson Fuel, MD       magnesium hydroxide (MILK OF MAGNESIA) suspension 30 mL  30 mL Oral Daily PRN Mansy, Jan A, MD        multivitamin with minerals tablet 1 tablet  1 tablet Oral Daily Gillis Santa, MD   1 tablet at 01/26/21 1520   ondansetron (ZOFRAN) tablet 4 mg  4 mg Oral Q6H PRN Mansy, Vernetta Honey, MD       Or  ondansetron (ZOFRAN) injection 4 mg  4 mg Intravenous Q6H PRN Mansy, Jan A, MD       risperiDONE (RISPERDAL M-TABS) disintegrating tablet 1 mg  1 mg Oral BID Isaiahs Chancy, Jackquline Denmark, MD       traZODone (DESYREL) tablet 25 mg  25 mg Oral QHS PRN Mansy, Vernetta Honey, MD        Musculoskeletal: Strength & Muscle Tone: within normal limits Gait & Station: normal Patient leans: N/A            Psychiatric Specialty Exam:  Presentation  General Appearance: Appropriate for Environment; Casual  Eye Contact:Fair  Speech:Blocked  Speech Volume:Normal  Handedness:Right   Mood and Affect  Mood: No data recorded Affect:Inappropriate; Full Range   Thought Process  Thought Processes:Disorganized  Descriptions of Associations:Loose  Orientation:Full (Time, Place and Person)  Thought Content:Illogical  History of Schizophrenia/Schizoaffective disorder:No  Duration of Psychotic Symptoms:N/A  Hallucinations:No data recorded Ideas of Reference:None  Suicidal Thoughts:No data recorded Homicidal Thoughts:No data recorded  Sensorium  Memory:Immediate Fair; Recent Poor  Judgment:Impaired  Insight:Lacking   Executive Functions  Concentration:Poor  Attention Span:Poor  Recall:Poor  Fund of Knowledge:Poor  Language:Poor   Psychomotor Activity  Psychomotor Activity: No data recorded  Assets  Assets:Communication Skills; Financial Resources/Insurance; Housing; Vocational/Educational; Social Support   Sleep  Sleep: No data recorded  Physical Exam: Physical Exam Vitals and nursing note reviewed.  Constitutional:      Appearance: Normal appearance.  HENT:     Head: Normocephalic and atraumatic.     Mouth/Throat:     Pharynx: Oropharynx is clear.  Eyes:     Pupils: Pupils  are equal, round, and reactive to light.  Cardiovascular:     Rate and Rhythm: Normal rate and regular rhythm.  Pulmonary:     Effort: Pulmonary effort is normal.     Breath sounds: Normal breath sounds.  Abdominal:     General: Abdomen is flat.     Palpations: Abdomen is soft.  Musculoskeletal:        General: Normal range of motion.  Skin:    General: Skin is warm and dry.  Neurological:     General: No focal deficit present.     Mental Status: She is alert. Mental status is at baseline.  Psychiatric:        Attention and Perception: She perceives auditory hallucinations.        Mood and Affect: Mood normal. Affect is blunt and tearful.        Speech: Speech is delayed.        Behavior: Behavior is slowed.        Thought Content: Thought content is delusional.   Review of Systems  Constitutional: Negative.   HENT: Negative.    Eyes: Negative.   Respiratory: Negative.    Cardiovascular: Negative.   Gastrointestinal: Negative.   Musculoskeletal: Negative.   Skin: Negative.   Neurological: Negative.   Psychiatric/Behavioral:  Positive for hallucinations. Negative for suicidal ideas. The patient is nervous/anxious.   Blood pressure 110/63, pulse 72, temperature 97.8 F (36.6 C), resp. rate 18, SpO2 100 %. There is no height or weight on file to calculate BMI.  Treatment Plan Summary: Plan patient perhaps slightly improved a little bit calmer but still having acute psychotic symptoms confusion and some mood lability.  Dose of Risperdal will be increased to 1 mg twice a day as she appears to be tolerating current dose without any difficulty.  Reviewed with mother and patient the plan  to get her home to [place] as soon as everyone feels she is safe and comfortable.  Patient was getting out of room and walking around the hallway with her mother today seemed to be slightly more stable.  We will continue to follow regularly.  Disposition:  see note  Mordecai Rasmussen, MD 01/26/2021 5:57  PM

## 2021-01-26 NOTE — Progress Notes (Signed)
Neurology Progress Note  S: Patient more interactive today, not actively hallucinating during our interview. She got some rest last night after initial dose of risperdal which she tolerated well.   Interval data:  CT head wwo contrast: normal  O:  Vitals:   01/26/21 1129 01/26/21 1536  BP: 98/68 110/63  Pulse: (!) 106 72  Resp:  18  Temp:  97.8 F (36.6 C)  SpO2: 100% 100%    Physical Exam Gen: A&Ox4, NAD HEENT: Atraumatic, normocephalic; oropharynx clear, tongue without atrophy or fasciculations. Resp: CTAB, normal work of breathing CV: RRR, extremities appear well-perfused. Abd: soft/NT/ND Extrem: Nml bulk; no cyanosis, clubbing, or edema.  Neuro: *MS: A&O x4. Follows multi-step commands.  *Speech: no dysarthria or aphasia, able to name and repeat. *CN:    I: Deferred   II,III: PERRLA, VFF by confrontation, optic discs not visualized 2/2 pupillary constriction   III,IV,VI: EOMI w/o nystagmus, no ptosis   V: Sensation intact from V1 to V3 to LT   VII: Eyelid closure was full.  Smile symmetric.   VIII: Hearing intact to voice   IX,X: Voice normal, palate elevates symmetrically    XI: SCM/trap 5/5 bilat   XII: Tongue protrudes midline, no atrophy or fasciculations  *Motor:   Normal bulk.  No tremor, rigidity or bradykinesia. No pronator drift.   Strength: Dlt Bic Tri WE WrF FgS Gr HF KnF KnE PlF DoF    Left 5 5 5 5 5 5 5 5 5 5 5 5     Right 5 5 5 5 5 5 5 5 5 5 5 5    *Sensory: Intact to light touch, pinprick, temperature vibration throughout. Symmetric. Propioception intact bilat.  No double-simultaneous extinction.  *Coordination:  Finger-to-nose, heel-to-shin, rapid alternating motions were intact. *Reflexes:  2+ and symmetric throughout without clonus; toes down-going bilat *Gait: deferred  A/P:   This is a 20 yo woman with a longstanding history of vasovagal syncope and recent onset psychosis (within past 30 days), 2nd covid infection 3 weeks ago who was admitted  to [facility] 01/24/21 for evaluation for recent onset of psychotic sx in the setting of decreased po intake and social withdrawal. Neurology is consulted after episode of syncope without head trauma . Patient has longstanding history of vasovagal syncope likely exacerbated by orthostasis in the setting of decreased po intake. Suspicion for seizure given history is v. low. Patient unable to tolerate EEG but I do not feel that procedure is currently necessary to complete. Given her acute onset of sx I would like to have a MRI brain to r/o structural causes of her acute behavioral change but she will not be able to tolerate it currently. She was able to tolerate  CT head wwo today which was normal. No further inpatient neurologic workup indicated at this time. If she were to have an episode in the future clinically consistent with seizure that in combination with recent onset psychosis would increase suspicion for autoimmune encephalitis but given that her only truly new sx are psychotic I do not feel further workup incl LP is indicated at this time. She is doing better today after starting risperdal. D/w psychiatry whose goal is to stabilize her enough from a psychiatric standpoint to return home with family to [place]. I agree with that plan. She should be referred to neurology upon return to [place] by her PCP there so that she can have a f/u outpatient visit to reassess if she has developed any further neurologic sx or neurologic  exam abnormalities that would warrant further w/u at that time.    Recommendations    - No further inpatient neurologic workup indicated - I will see her in AM to discuss CT results and answer questions, after that she can be discharged back to [facility] whenever medical and psychiatry teams feel appropriate  Bing Neighbors, MD [facility] [phone]  If 7pm- 7am, please page neurology on call as listed in AMION.

## 2021-01-27 DIAGNOSIS — E440 Moderate protein-calorie malnutrition: Secondary | ICD-10-CM | POA: Insufficient documentation

## 2021-01-27 LAB — URINE CULTURE: Culture: <10000 — AB

## 2021-01-27 LAB — GLUCOSE, CAPILLARY: Glucose-Capillary: 81 mg/dL (ref 70–99)

## 2021-01-27 MED ORDER — LORAZEPAM 2 MG/ML IJ SOLN
0.5000 mg | INTRAMUSCULAR | Status: DC | PRN
  Administered 2021-01-27: 18:00:00 0.5 mg via INTRAVENOUS
  Filled 2021-01-27: qty 1

## 2021-01-27 NOTE — Consult Note (Signed)
[facility] Face-to-Face Psychiatry Consult   Reason for Consult: Follow-up for 20 year old woman with new onset psychotic symptoms Referring Physician: Lucianne Muss Patient Identification: Sandra [name] MRN:  [id number] Principal Diagnosis: Psychosis (HCC) Diagnosis:  Principal Problem:   Psychosis (HCC) Active Problems:   Near syncope   Syncope   Malnutrition of moderate degree   Total Time spent with patient: 30 minutes  Subjective:   Sandra [name] is a 20 y.o. female patient admitted with "I am better".  HPI: Patient seen chart reviewed.  Patient is dressed in appropriate street clothing today.  Once again she is walking around the unit with her mother and the sitter.  She was a little more attentive and participatory in the interview today.  Made better eye contact.  Initially said that she was better although not clear what the specifics were.  She did sleep and she is eating better.  She says she is still however hearing voices.  Still appears to be confused.  We had a discussion about transfer to the psychiatric ward and the patient seemed to get confused by the options that were being discussed.  Tolerating risperidone no sign of any side effects  Past Psychiatric History: No major history just some anxiety treated with therapy in the past  Risk to Self:   Risk to Others:   Prior Inpatient Therapy:   Prior Outpatient Therapy:    Past Medical History:  Past Medical History:  Diagnosis Date   Medical history non-contributory    No past surgical history on file. Family History: No family history on file. Family Psychiatric  History: See previous Social History:  Social History   Substance and Sexual Activity  Alcohol Use Not Currently     Social History   Substance and Sexual Activity  Drug Use Yes   Types: Marijuana   Comment: Mushrooms, Delta 8    Social History   Socioeconomic History   Marital status: Single    Spouse name: Not on file   Number of children: Not on file    Years of education: Not on file   Highest education level: Not on file  Occupational History   Not on file  Tobacco Use   Smoking status: Some Days    Packs/day: 0.25    Years: 1.00    Pack years: 0.25    Types: Cigarettes   Smokeless tobacco: Never  Vaping Use   Vaping Use: Some days  Substance and Sexual Activity   Alcohol use: Not Currently   Drug use: Yes    Types: Marijuana    Comment: Mushrooms, Delta 8   Sexual activity: Not Currently  Other Topics Concern   Not on file  Social History Narrative   Not on file   Social Determinants of Health   Financial Resource Strain: Not on file  Food Insecurity: Not on file  Transportation Needs: Not on file  Physical Activity: Not on file  Stress: Not on file  Social Connections: Not on file   Additional Social History:    Allergies:   Allergies  Allergen Reactions   Tobramycin Rash    Had it when a kid    Amoxicillin Rash    Reports rash    Labs:  Results for orders placed or performed during the hospital encounter of 01/25/21 (from the past 48 hour(s))  Urine Culture     Status: Abnormal   Collection Time: 01/25/21  9:26 PM   Specimen: Urine, Random  Result Value Ref Range  Specimen Description      URINE, RANDOM Performed at [facility], [address] [place], [place] [address]    Special Requests      NONE Performed at [facility], [address], [place], [place] [address]    Culture (A)     <10,000 COLONIES/mL INSIGNIFICANT GROWTH Performed at [facility], [address] [place], [place] [address]    Report Status 01/27/2021 FINAL   Vitamin B12     Status: None   Collection Time: 01/26/21 10:00 AM  Result Value Ref Range   Vitamin B-12 519 180 - 914 pg/mL    Comment: (NOTE) This assay is not validated for testing neonatal or myeloproliferative syndrome specimens for Vitamin B12 levels. Performed at [facility], [address], [place], [place] [address]    VITAMIN D 25 Hydroxy (Vit-D Deficiency, Fractures)     Status: None   Collection Time: 01/26/21 10:00 AM  Result Value Ref Range   Vit D, 25-Hydroxy 30.94 30 - 100 ng/mL    Comment: (NOTE) Vitamin D deficiency has been defined by the Institute of Medicine  and an Endocrine Society practice guideline as a level of serum 25-OH  vitamin D less than 20 ng/mL (1,2). The Endocrine Society went on to  further define vitamin D insufficiency as a level between 21 and 29  ng/mL (2).  1. IOM (Institute of Medicine). 2010. Dietary reference intakes for  calcium and D. [place]: [employer]. 2. Holick MF, Binkley , Bischoff-Ferrari HA, et al. Evaluation,  treatment, and prevention of vitamin D deficiency: an Endocrine  Society clinical practice guideline, JCEM. 2011 Jul; 96(7): 1911-30.  Performed at [facility], [address] [place] [employer] [place], [place] [address]   Iron and TIBC     Status: None   Collection Time: 01/26/21 10:00 AM  Result Value Ref Range   Iron 63 28 - 170 ug/dL   TIBC 038 882 - 800 ug/dL   Saturation Ratios 23 10.4 - 31.8 %   UIBC 207 ug/dL    Comment: Performed at [facility], [address] [place], [place] [address]  Folate     Status: None   Collection Time: 01/26/21 10:00 AM  Result Value Ref Range   Folate 7.1 >5.9 ng/mL    Comment: Performed at [facility], [address] [place], [place] [address]  Magnesium     Status: None   Collection Time: 01/26/21 10:00 AM  Result Value Ref Range   Magnesium 1.8 1.7 - 2.4 mg/dL    Comment: Performed at [facility] [address] [place], [place] [address]  Phosphorus     Status: None   Collection Time: 01/26/21 10:00 AM  Result Value Ref Range   Phosphorus 3.1 2.5 - 4.6 mg/dL    Comment: Performed at [facility], [address], [place], [place] [address]    Current Facility-Administered Medications  Medication Dose Route Frequency Provider Last  Rate Last Admin   0.9 %  sodium chloride infusion   Intravenous Continuous Gillis Santa, MD 100 mL/hr at 01/27/21 0712 Infusion Verify at 01/27/21 5374   acetaminophen (TYLENOL) tablet 650 mg  650 mg Oral Q6H PRN Mansy, Jan A, MD       Or   acetaminophen (TYLENOL) suppository 650 mg  650 mg Rectal Q6H PRN Mansy, Jan A, MD       aspirin EC tablet 81 mg  81 mg  Oral Daily Mansy, Jan A, MD   81 mg at 01/27/21 1105   cefTRIAXone (ROCEPHIN) 1 g in sodium chloride 0.9 % 100 mL IVPB  1 g Intravenous Q24H Mansy, Jan A, MD   Stopped at 01/27/21 0435   enoxaparin (LOVENOX) injection 40 mg  40 mg Subcutaneous QHS Mansy, Jan A, MD       feeding supplement (ENSURE ENLIVE / ENSURE PLUS) liquid 237 mL  237 mL Oral TID BM Gillis Santa, MD   237 mL at 01/27/21 1107   LORazepam (ATIVAN) injection 0.5 mg  0.5 mg Intravenous Q4H PRN Mansy, Jan A, MD   0.5 mg at 01/25/21 1054   LORazepam (ATIVAN) injection 1 mg  1 mg Intravenous Once PRN Jefferson Fuel, MD       magnesium hydroxide (MILK OF MAGNESIA) suspension 30 mL  30 mL Oral Daily PRN Mansy, Jan A, MD       multivitamin with minerals tablet 1 tablet  1 tablet Oral Daily Gillis Santa, MD   1 tablet at 01/27/21 1105   ondansetron (ZOFRAN) tablet 4 mg  4 mg Oral Q6H PRN Mansy, Jan A, MD       Or   ondansetron [facility]) injection 4 mg  4 mg Intravenous Q6H PRN Mansy, Jan A, MD       risperiDONE (RISPERDAL M-TABS) disintegrating tablet 1 mg  1 mg Oral BID Yesena Reaves, Jackquline Denmark, MD   1 mg at 01/27/21 1105   traZODone (DESYREL) tablet 25 mg  25 mg Oral QHS PRN Mansy, Vernetta Honey, MD        Musculoskeletal: Strength & Muscle Tone: within normal limits Gait & Station: normal Patient leans: N/A            Psychiatric Specialty Exam:  Presentation  General Appearance: Appropriate for Environment; Casual  Eye Contact:Fair  Speech:Blocked  Speech Volume:Normal  Handedness:Right   Mood and Affect  Mood: No data recorded Affect:Inappropriate; Full  Range   Thought Process  Thought Processes:Disorganized  Descriptions of Associations:Loose  Orientation:Full (Time, Place and Person)  Thought Content:Illogical  History of Schizophrenia/Schizoaffective disorder:No  Duration of Psychotic Symptoms:N/A  Hallucinations:No data recorded Ideas of Reference:None  Suicidal Thoughts:No data recorded Homicidal Thoughts:No data recorded  Sensorium  Memory:Immediate Fair; Recent Poor  Judgment:Impaired  Insight:Lacking   Executive Functions  Concentration:Poor  Attention Span:Poor  Recall:Poor  Fund of Knowledge:Poor  Language:Poor   Psychomotor Activity  Psychomotor Activity: No data recorded  Assets  Assets:Communication Skills; Financial Resources/Insurance; Housing; Vocational/Educational; Social Support   Sleep  Sleep: No data recorded  Physical Exam: Physical Exam Vitals and nursing note reviewed.  Constitutional:      Appearance: Normal appearance.  HENT:     Head: Normocephalic and atraumatic.     Mouth/Throat:     Pharynx: Oropharynx is clear.  Eyes:     Pupils: Pupils are equal, round, and reactive to light.  Cardiovascular:     Rate and Rhythm: Normal rate and regular rhythm.  Pulmonary:     Effort: Pulmonary effort is normal.     Breath sounds: Normal breath sounds.  Abdominal:     General: Abdomen is flat.     Palpations: Abdomen is soft.  Musculoskeletal:        General: Normal range of motion.  Skin:    General: Skin is warm and dry.  Neurological:     General: No focal deficit present.     Mental Status: She is alert. Mental status is at  baseline.  Psychiatric:        Attention and Perception: She is inattentive. She perceives auditory hallucinations.        Mood and Affect: Affect is blunt.        Speech: Speech is delayed.        Behavior: Behavior is slowed.        Thought Content: Thought content is paranoid. Thought content does not include homicidal or suicidal ideation.         Cognition and Memory: Cognition is impaired.   Review of Systems  Constitutional: Negative.   HENT: Negative.    Eyes: Negative.   Respiratory: Negative.    Cardiovascular: Negative.   Gastrointestinal: Negative.   Musculoskeletal: Negative.   Skin: Negative.   Neurological: Negative.   Psychiatric/Behavioral:  Positive for hallucinations. The patient is nervous/anxious.   Blood pressure (!) 114/57, pulse 78, temperature 98.3 F (36.8 C), temperature source Oral, resp. rate 16, height 5' 5.51" (1.664 m), weight 48.5 kg, SpO2 100 %. Body mass index is 17.52 kg/m.  Treatment Plan Summary: Medication management and Plan educated patient and family and that recovery from psychosis is often gradual.  I would not change the current order for Risperdal.  Educated them that we will hope to see some gradual improvement daily.  No beds available today on the psychiatric ward to allow for transfer.  We will continue to follow up and monitor on this current ward and discuss the options of possible transfer to psychiatry versus discharge with her family when everyone feels safe with it.  Disposition: Recommend psychiatric Inpatient admission when medically cleared.  Mordecai Rasmussen, MD 01/27/2021 3:33 PM

## 2021-01-27 NOTE — Progress Notes (Signed)
[facility] Progress Note  Patient: Sandra [name]    SAY:[id number]  DOA: 01/25/2021     Date of Service: the patient was seen and examined on 01/27/2021  No chief complaint on file.  Brief hospital course: Sandra [name] is a 20 y.o. 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  She denies any falls or head injuries.  She was assisted to the floor.  She was incontinent of urine as she was being assisted to the floor by IPR.  Her Peer reported that her eyes rolled back in her head that she started to go down when he caught her.  She has been having nausea earlier and was given some crackers and ginger ale.  She has been tearful and confused not knowing why she is in the behavioral unit.  Her blood glucose was 99 and blood pressure was 120/72 with a heart rate of 94 and respiratory rate of 16 and pulse oximetry 100% on room air.  The patient was fairly somnolent and a very poor historian during my interview.  She was fairly confused.  She denies any previous history of seizures.  She denies any paresthesias or focal muscle weakness.  No dysuria, oliguria, urinary frequency or urgency or flank pain.  Upon arrival to the medical floor her blood pressure was 117/858 with a heart rate of 62 respiratory rate of 16 temperature 97.8 and pulse oximetry 100% on room air.   EKG as reviewed by me : Showed normal sinus rhythm with a rate of 89 with right axis deviation and right atrial enlargement. Imaging: Stat head CT scan without contrast is currently pending.  Stat portable chest x-ray is currently pending.  Stat labs are currently pending.  On 9/10 her CMP was remarkable for an albumin of 5.3 and total protein of 8.2 CBC was within normal.  Influenza antigens and COVID-19 PCR came back negative.  Tylenol level was less than 10 and  salicylate less than 7.  UA showed many bacteria with 6-10 WBCs and more than 160 ketones with trace leukocytes.  Urine pregnancy test was negative.  The patient will be admitted to an observation medically monitored bed for further evaluation and management.   Assessment and Plan: Active Problems:   Near syncope   # Syncope with associated urinary incontinence, rule out seizures. -The patient will be admitted to an observation medically monitored bed. - Orthostatics positive, blood pressure dropped but patient remained asymptomatic --TTE shows LVEF 55%, no any other significant findings --CT head no acute findings --Continue IV fluid for hydration, continue monitor on telemetry for arrhythmias.   -- Patient was seen by neurology, recommended no EEG at this time. - TSH level 1.6 wnl Neurology ordered CT head with and without contrast, which did not show any acute CNS findings  Patient was cleared by neurology point of view to discharge  # Generalized anxiety disorder. - d/c;d Cymbalta home med - continue as needed IV Ativan.   #  Brief reactive psychosis. - Continue Risperdal 1 mg p.o. twice daily  -- Psych consulted, patient is a still under IVC, not cleared to be discharged.  Patient may need to be transferred to behavioral health unit but no beds available today. Continue to follow psych for further recommendation Trazodone 25 mg p.o. nightly as needed for sleep was continued  #  Recent bacteriuria. -  Patient denies any symptoms of dysuria --Urine culture <10 K, no further work-up needed - Continue IV Rocephin for now for total 3 to 5 days    Diet: Regular diet DVT Prophylaxis: Subcutaneous Lovenox   Advance goals of care discussion: Full code  Family Communication: family was  present at bedside, at the time of interview.  The pt provided permission to discuss medical plan with the family. Opportunity was given to ask question and all questions were answered  satisfactorily.   Disposition:  Pt is from psych unit, admitted with syncopal episode, patient has orthostatic hypotension, on IV fluid for hydration, which precludes a safe discharge. Discharge to psych unit, when orthostatics vital signs will improve.  Most likely tomorrow a.m.  Subjective: No significant overnight issues, patient slept well, she was little more awake and alert as compared to yesterday, she was standing and denied any headache or dizziness.  Patient did walk with mother and sitter in the hallway. Denied any active issues.    Physical Exam: General:  alert oriented to time, place, and person.  Appear in mild distress, affect depressed Eyes: PERRLA ENT: Oral Mucosa Clear, moist  Neck: no JVD,  Cardiovascular: S1 and S2 Present, no Murmur,  Respiratory: good respiratory effort, Bilateral Air entry equal and Decreased, no Crackles, no wheezes Abdomen: Bowel Sound present, Soft and no tenderness,  Skin: no rashes Extremities: no Pedal edema, no calf tenderness Neurologic: without any new focal findings Gait not checked due to patient safety concerns  Vitals:   01/26/21 1536 01/26/21 2152 01/27/21 0818 01/27/21 1052  BP: 110/63 121/68 112/66 (!) 114/57  Pulse: 72 73 88 78  Resp: 18 20 14 16   Temp: 97.8 F (36.6 C) 98 F (36.7 C) 97.6 F (36.4 C) 98.3 F (36.8 C)  TempSrc:   Oral Oral  SpO2: 100% 100% 100% 100%  Weight:  48.5 kg    Height:  5' 5.51" (1.664 m)      Intake/Output Summary (Last 24 hours) at 01/27/2021 1613 Last data filed at 01/27/2021 1300 Gross per 24 hour  Intake 2955.33 ml  Output 1500 ml  Net 1455.33 ml   Filed Weights   01/26/21 2152  Weight: 48.5 kg    Data Reviewed: I have personally reviewed and interpreted daily labs, tele strips, imagings as discussed above. I reviewed all nursing notes, pharmacy notes, vitals, pertinent old records I have discussed plan of care as described above with RN and patient/family.  CBC: Recent  Labs  Lab 01/22/21 1709 01/25/21 0151 01/25/21 0354  WBC 8.5 6.0 5.7  NEUTROABS 6.5 3.9  --   HGB 15.0 14.5 13.2  HCT 42.5 41.0 37.8  MCV 81.7 82.3 83.4  PLT 370 304 271   Basic Metabolic Panel: Recent Labs  Lab 01/22/21 1703 01/25/21 0151 01/25/21 0354 01/26/21 1000  NA 139 137 138  --   K 4.1 3.7 3.8  --   CL 107 101 105  --   CO2 20* 24 25  --   GLUCOSE 88 85 87  --   BUN 10 11 12   --   CREATININE 0.65 0.66 0.58  --   CALCIUM 9.9 9.9 9.1  --   MG  --   --   --  1.8  PHOS  --   --   --  3.1    Studies: CT HEAD W & WO CONTRAST (01/28/21)  Result Date: 01/26/2021 CLINICAL DATA:  Mental status change.  Psychosis. EXAM: CT HEAD WITHOUT  AND WITH CONTRAST TECHNIQUE: Contiguous axial images were obtained from the base of the skull through the vertex without and with intravenous contrast CONTRAST:  60mL OMNIPAQUE IOHEXOL 350 MG/ML SOLN COMPARISON:  CT head 01/25/2021 FINDINGS: Brain: No evidence of acute infarction, hemorrhage, hydrocephalus, extra-axial collection or mass lesion/mass effect. Normal enhancement following contrast administration Vascular: Negative for hyperdense vessel. Normal arterial and venous enhancement. Skull: Negative Sinuses/Orbits: Mild mucosal edema right maxillary sinus. Remaining sinuses clear. Negative orbit Other: None IMPRESSION: Normal CT of the brain with contrast. Electronically Signed   By: Marlan Palau M.D.   On: 01/26/2021 18:54    Scheduled Meds:  aspirin EC  81 mg Oral Daily   enoxaparin (LOVENOX) injection  40 mg Subcutaneous QHS   feeding supplement  237 mL Oral TID BM   multivitamin with minerals  1 tablet Oral Daily   risperiDONE  1 mg Oral BID   Continuous Infusions:  sodium chloride 100 mL/hr at 01/27/21 0712   cefTRIAXone (ROCEPHIN)  IV Stopped (01/27/21 0435)   PRN Meds: acetaminophen **OR** acetaminophen, LORazepam, LORazepam, magnesium hydroxide, ondansetron **OR** ondansetron (ZOFRAN) IV, traZODone  Time spent: 35  minutes  Author: Gillis Santa. MD [facility] 01/27/2021 4:13 PM  To reach On-call, see care teams to locate the attending and reach out to them via [email or web address]. If 7PM-7AM, please contact night-coverage If you still have difficulty reaching the attending provider, please page the [facility] (Director on Call) for [facility] on amion for assistance.

## 2021-01-27 NOTE — Progress Notes (Signed)
Neurology Progress Note  S: Patient interactive today and not actively hallucinating. No new neurologic complaints.  Interval data:  CT head wwo contrast: normal  O:  Vitals:   01/27/21 0818 01/27/21 1052  BP: 112/66 (!) 114/57  Pulse: 88 78  Resp: 14 16  Temp: 97.6 F (36.4 C) 98.3 F (36.8 C)  SpO2: 100% 100%    Physical Exam Gen: A&Ox4, NAD HEENT: Atraumatic, normocephalic; oropharynx clear, tongue without atrophy or fasciculations. Resp: CTAB, normal work of breathing CV: RRR, extremities appear well-perfused. Abd: soft/NT/ND Extrem: Nml bulk; no cyanosis, clubbing, or edema.  Neuro: *MS: A&O x4. Follows multi-step commands.  *Speech: no dysarthria or aphasia, able to name and repeat. *CN:    I: Deferred   II,III: PERRLA, VFF by confrontation, optic discs not visualized 2/2 pupillary constriction   III,IV,VI: EOMI w/o nystagmus, no ptosis   V: Sensation intact from V1 to V3 to LT   VII: Eyelid closure was full.  Smile symmetric.   VIII: Hearing intact to voice   IX,X: Voice normal, palate elevates symmetrically    XI: SCM/trap 5/5 bilat   XII: Tongue protrudes midline, no atrophy or fasciculations  *Motor:   Normal bulk.  No tremor, rigidity or bradykinesia. No pronator drift.   Strength: Dlt Bic Tri WE WrF FgS Gr HF KnF KnE PlF DoF    Left 5 5 5 5 5 5 5 5 5 5 5 5     Right 5 5 5 5 5 5 5 5 5 5 5 5    *Sensory: Intact to light touch, pinprick, temperature vibration throughout. Symmetric. Propioception intact bilat.  No double-simultaneous extinction.  *Coordination:  Finger-to-nose, heel-to-shin, rapid alternating motions were intact. *Reflexes:  2+ and symmetric throughout without clonus; toes down-going bilat *Gait: deferred  A/P:   This is a 20 yo woman with a longstanding history of vasovagal syncope and recent onset psychosis (within past 30 days), 2nd covid infection 3 weeks ago who was admitted to [facility] 01/24/21 for evaluation for recent onset of psychotic  sx in the setting of decreased po intake and social withdrawal. Neurology is consulted after episode of syncope without head trauma . Patient has longstanding history of vasovagal syncope likely exacerbated by orthostasis in the setting of decreased po intake. Suspicion for seizure given history is v. low. Patient unable to tolerate EEG but I do not feel that procedure is currently necessary to complete. Given her acute onset of sx I would like to have a MRI brain to r/o structural causes of her acute behavioral change but she will not be able to tolerate it currently. She was able to tolerate  CT head wwo which was normal. No further inpatient neurologic workup indicated at this time.   If she were to have an episode in the future clinically consistent with seizure that in combination with recent onset psychosis would increase suspicion for autoimmune encephalitis but given that her only truly new sx are psychotic I do not feel further workup incl LP is indicated at this time. She is doing better after starting risperdal. D/w psychiatry whose goal is to stabilize her enough from a psychiatric standpoint to return home with family to [place]. I agree with that plan. She should be referred to neurology upon return to [place] by her PCP there so that she can have a f/u outpatient visit to reassess if she has developed any further neurologic sx or neurologic exam abnormalities that would warrant further w/u at that time.  Recommendations    - No further inpatient neurologic workup indicated - Further disposition per psychiatry and medicine teams  Bing Neighbors, MD [facility] [phone]  If 7pm- 7am, please page neurology on call as listed in AMION.

## 2021-01-28 MED ORDER — VITAMIN D (ERGOCALCIFEROL) 1.25 MG (50000 UNIT) PO CAPS
50000.0000 [IU] | ORAL_CAPSULE | ORAL | Status: DC
  Administered 2021-01-28: 50000 [IU] via ORAL
  Filled 2021-01-28: qty 1

## 2021-01-28 MED ORDER — ADULT MULTIVITAMIN LIQUID CH
15.0000 mL | Freq: Every day | ORAL | Status: DC
  Filled 2021-01-28 (×4): qty 15

## 2021-01-28 NOTE — Progress Notes (Signed)
[facility] Progress Note  Patient: Sandra [name]    GLO:[id number]  DOA: 01/25/2021     Date of Service: the patient was seen and examined on 01/28/2021  No chief complaint on file.  Brief hospital course: Sandra [name] is a 20 y.o. 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  She denies any falls or head injuries.  She was assisted to the floor.  She was incontinent of urine as she was being assisted to the floor by IPR.  Her Peer reported that her eyes rolled back in her head that she started to go down when he caught her.  She has been having nausea earlier and was given some crackers and ginger ale.  She has been tearful and confused not knowing why she is in the behavioral unit.  Her blood glucose was 99 and blood pressure was 120/72 with a heart rate of 94 and respiratory rate of 16 and pulse oximetry 100% on room air.  The patient was fairly somnolent and a very poor historian during my interview.  She was fairly confused.  She denies any previous history of seizures.  She denies any paresthesias or focal muscle weakness.  No dysuria, oliguria, urinary frequency or urgency or flank pain.  Upon arrival to the medical floor her blood pressure was 117/858 with a heart rate of 62 respiratory rate of 16 temperature 97.8 and pulse oximetry 100% on room air.   EKG as reviewed by me : Showed normal sinus rhythm with a rate of 89 with right axis deviation and right atrial enlargement. Imaging: Stat head CT scan without contrast is currently pending.  Stat portable chest x-ray is currently pending.  Stat labs are currently pending.  On 9/10 her CMP was remarkable for an albumin of 5.3 and total protein of 8.2 CBC was within normal.  Influenza antigens and COVID-19 PCR came back negative.  Tylenol level was less than 10 and  salicylate less than 7.  UA showed many bacteria with 6-10 WBCs and more than 160 ketones with trace leukocytes.  Urine pregnancy test was negative.  The patient will be admitted to an observation medically monitored bed for further evaluation and management.   Assessment and Plan: Active Problems:   Near syncope   # Syncope with associated urinary incontinence, rule out seizures. -The patient will be admitted to an observation medically monitored bed. - Orthostatics positive, blood pressure dropped but patient remained asymptomatic --TTE shows LVEF 55%, no any other significant findings --CT head no acute findings --Continue IV fluid for hydration, continue monitor on telemetry for arrhythmias.   -- Patient was seen by neurology, recommended no EEG at this time. - TSH level 1.6 wnl Neurology ordered CT head with and without contrast, which did not show any acute CNS findings  Patient was cleared by neurology point of view to discharge  # Generalized anxiety disorder. - d/c;d Cymbalta home med - continue as needed IV Ativan.   #  Brief reactive psychosis. - Continue Risperdal 1 mg p.o. twice daily  -- Psych consulted, patient is a still under IVC, not cleared to be discharged.  Patient may need to be transferred to behavioral health unit but no beds available today. Continue to follow psych for further recommendation Trazodone 25 mg p.o. nightly as needed for sleep was continued  #  Recent bacteriuria. -  Patient denies any symptoms of dysuria --Urine culture <10 K, no further work-up needed - Continue IV Rocephin for now for total 3 to 5 days    Diet: Regular diet DVT Prophylaxis: Subcutaneous Lovenox   Advance goals of care discussion: Full code  Family Communication: family was  present at bedside, at the time of interview.  The pt provided permission to discuss medical plan with the family. Opportunity was given to ask question and all questions were answered  satisfactorily.   Disposition:  Pt is from psych unit, admitted with syncopal episode, patient has orthostatic hypotension, on IV fluid for hydration, which precludes a safe discharge. Discharge to psych unit, when bed will be available at [facility], no beds today.  Awaiting psych if patient is stable from their point of view to discharge.  Subjective: No significant overnight issues, patient slept well last night.  Patient still is over thinking about family issues, advised to relax and go with the flow, and do not think too much about any of the problems. Patient denies any headache or dizziness, no chest palpitation, no shortness of breath.   Physical Exam: General:  alert oriented to time, place, and person.  Appear in mild distress, affect depressed Eyes: PERRLA ENT: Oral Mucosa Clear, moist  Neck: no JVD,  Cardiovascular: S1 and S2 Present, no Murmur,  Respiratory: good respiratory effort, Bilateral Air entry equal and Decreased, no Crackles, no wheezes Abdomen: Bowel Sound present, Soft and no tenderness,  Skin: no rashes Extremities: no Pedal edema, no calf tenderness Neurologic: without any new focal findings Gait not checked due to patient safety concerns  Vitals:   01/27/21 1950 01/28/21 0629 01/28/21 1002 01/28/21 1514  BP: 115/75 102/69 108/71 115/72  Pulse: 93 73 (!) 107 (!) 107  Resp: 18 19 18    Temp: 97.9 F (36.6 C) 98 F (36.7 C) 98 F (36.7 C) 97.8 F (36.6 C)  TempSrc: Oral  Oral Oral  SpO2: 100% 100% 100% 100%  Weight:      Height:        Intake/Output Summary (Last 24 hours) at 01/28/2021 1610 Last data filed at 01/28/2021 1400 Gross per 24 hour  Intake 1951.36 ml  Output --  Net 1951.36 ml   Filed Weights   01/26/21 2152  Weight: 48.5 kg    Data Reviewed: I have personally reviewed and interpreted daily labs, tele strips, imagings as discussed above. I reviewed all nursing notes, pharmacy notes, vitals, pertinent old records I have discussed plan  of care as described above with RN and patient/family.  CBC: Recent Labs  Lab 01/22/21 1709 01/25/21 0151 01/25/21 0354  WBC 8.5 6.0 5.7  NEUTROABS 6.5 3.9  --   HGB 15.0 14.5 13.2  HCT 42.5 41.0 37.8  MCV 81.7 82.3 83.4  PLT 370 304 271   Basic Metabolic Panel: Recent Labs  Lab 01/22/21 1703 01/25/21 0151 01/25/21 0354 01/26/21 1000  NA 139 137 138  --   K 4.1 3.7 3.8  --   CL 107 101 105  --   CO2 20* 24 25  --   GLUCOSE 88 85 87  --   BUN 10 11 12   --   CREATININE 0.65 0.66 0.58  --   CALCIUM 9.9 9.9 9.1  --   MG  --   --   --  1.8  PHOS  --   --   --  3.1    Studies: No results found.  Scheduled Meds:  enoxaparin (LOVENOX) injection  40 mg Subcutaneous QHS   feeding supplement  237 mL Oral TID BM   multivitamin  15 mL Oral Daily   risperiDONE  1 mg Oral BID   Vitamin D (Ergocalciferol)  50,000 Units Oral Q7 days   Continuous Infusions:  sodium chloride 100 mL/hr at 01/28/21 1235   cefTRIAXone (ROCEPHIN)  IV Stopped (01/28/21 0438)   PRN Meds: acetaminophen **OR** acetaminophen, LORazepam, LORazepam, magnesium hydroxide, ondansetron **OR** ondansetron (ZOFRAN) IV, traZODone  Time spent: 35 minutes  Author: Gillis Santa. MD [facility] 01/28/2021 4:10 PM  To reach On-call, see care teams to locate the attending and reach out to them via [email or web address]. If 7PM-7AM, please contact night-coverage If you still have difficulty reaching the attending provider, please page the [facility] (Director on Call) for [facility] on amion for assistance.

## 2021-01-28 NOTE — Consult Note (Signed)
[facility] Face-to-Face Psychiatry Consult   Reason for Consult: Follow-up consult 20 year old woman recovering from first onset psychosis Referring Physician: Lucianne Muss Patient Identification: Sandra [name] MRN:  [id number] Principal Diagnosis: Psychosis [facility]) Diagnosis:  Principal Problem:   Psychosis (HCC) Active Problems:   Near syncope   Syncope   Malnutrition of moderate degree   Total Time spent with patient: 30 minutes  Subjective:   Sandra [name] is a 20 y.o. female patient admitted with "I am better".  HPI: Patient seen chart reviewed.  Spoke with the patient's parents as well.  Patient was sitting up in bed interacting with parents.  Neatly dressed.  She initially said she was feeling better.  Specifically she said the voices while they are still present are less bothersome than previously.  She slept okay last night.  Nevertheless she says she still feels paranoid and unsafe about traveling.  Has a very anxious withdrawn look about her speaks only in a whisper.  When parents specifically discussed traveling back to [place] patient said she did not feel ready for that.  She has been tolerating medication without any side effects.  Past Psychiatric History: Outpatient treatment of anxiety but no previous psychosis before the last several weeks  Risk to Self:   Risk to Others:   Prior Inpatient Therapy:   Prior Outpatient Therapy:    Past Medical History:  Past Medical History:  Diagnosis Date   Medical history non-contributory    No past surgical history on file. Family History: No family history on file. Family Psychiatric  History: See previous Social History:  Social History   Substance and Sexual Activity  Alcohol Use Not Currently     Social History   Substance and Sexual Activity  Drug Use Yes   Types: Marijuana   Comment: Mushrooms, Delta 8    Social History   Socioeconomic History   Marital status: Single    Spouse name: Not on file   Number of children: Not  on file   Years of education: Not on file   Highest education level: Not on file  Occupational History   Not on file  Tobacco Use   Smoking status: Some Days    Packs/day: 0.25    Years: 1.00    Pack years: 0.25    Types: Cigarettes   Smokeless tobacco: Never  Vaping Use   Vaping Use: Some days  Substance and Sexual Activity   Alcohol use: Not Currently   Drug use: Yes    Types: Marijuana    Comment: Mushrooms, Delta 8   Sexual activity: Not Currently  Other Topics Concern   Not on file  Social History Narrative   Not on file   Social Determinants of Health   Financial Resource Strain: Not on file  Food Insecurity: Not on file  Transportation Needs: Not on file  Physical Activity: Not on file  Stress: Not on file  Social Connections: Not on file   Additional Social History:    Allergies:   Allergies  Allergen Reactions   Tobramycin Rash    Had it when a kid    Amoxicillin Rash    Reports rash    Labs:  Results for orders placed or performed during the hospital encounter of 01/25/21 (from the past 48 hour(s))  Glucose, capillary     Status: None   Collection Time: 01/27/21  7:45 PM  Result Value Ref Range   Glucose-Capillary 81 70 - 99 mg/dL    Comment: Glucose reference range  applies only to samples taken after fasting for at least 8 hours.    Current Facility-Administered Medications  Medication Dose Route Frequency Provider Last Rate Last Admin   0.9 %  sodium chloride infusion   Intravenous Continuous Gillis Santa, MD 100 mL/hr at 01/28/21 1235 New Bag at 01/28/21 1235   acetaminophen (TYLENOL) tablet 650 mg  650 mg Oral Q6H PRN Mansy, Jan A, MD       Or   acetaminophen (TYLENOL) suppository 650 mg  650 mg Rectal Q6H PRN Mansy, Jan A, MD       cefTRIAXone (ROCEPHIN) 1 g in sodium chloride 0.9 % 100 mL IVPB  1 g Intravenous Q24H Mansy, Jan A, MD   Stopped at 01/28/21 0438   enoxaparin (LOVENOX) injection 40 mg  40 mg Subcutaneous QHS Mansy, Jan A, MD        feeding supplement (ENSURE ENLIVE / ENSURE PLUS) liquid 237 mL  237 mL Oral TID BM Gillis Santa, MD   237 mL at 01/28/21 1017   LORazepam (ATIVAN) injection 0.5 mg  0.5 mg Intravenous Q4H PRN Gillis Santa, MD   0.5 mg at 01/27/21 1739   LORazepam (ATIVAN) injection 1 mg  1 mg Intravenous Once PRN Jefferson Fuel, MD       magnesium hydroxide (MILK OF MAGNESIA) suspension 30 mL  30 mL Oral Daily PRN Mansy, Jan A, MD       multivitamin liquid 15 mL  15 mL Oral Daily Gillis Santa, MD       ondansetron [facility]) tablet 4 mg  4 mg Oral Q6H PRN Mansy, Jan A, MD       Or   ondansetron [facility]) injection 4 mg  4 mg Intravenous Q6H PRN Mansy, Jan A, MD       risperiDONE (RISPERDAL M-TABS) disintegrating tablet 1 mg  1 mg Oral BID Aadvik Roker T, MD   1 mg at 01/28/21 1010   traZODone (DESYREL) tablet 25 mg  25 mg Oral QHS PRN Mansy, Vernetta Honey, MD       Vitamin D (Ergocalciferol) (DRISDOL) capsule 50,000 Units  50,000 Units Oral Q7 days Gillis Santa, MD   50,000 Units at 01/28/21 1011    Musculoskeletal: Strength & Muscle Tone: within normal limits Gait & Station: normal Patient leans: N/A            Psychiatric Specialty Exam:  Presentation  General Appearance: Appropriate for Environment; Casual  Eye Contact:Fair  Speech:Blocked  Speech Volume:Normal  Handedness:Right   Mood and Affect  Mood: No data recorded Affect:Inappropriate; Full Range   Thought Process  Thought Processes:Disorganized  Descriptions of Associations:Loose  Orientation:Full (Time, Place and Person)  Thought Content:Illogical  History of Schizophrenia/Schizoaffective disorder:No  Duration of Psychotic Symptoms:N/A  Hallucinations:No data recorded Ideas of Reference:None  Suicidal Thoughts:No data recorded Homicidal Thoughts:No data recorded  Sensorium  Memory:Immediate Fair; Recent Poor  Judgment:Impaired  Insight:Lacking   Executive Functions   Concentration:Poor  Attention Span:Poor  Recall:Poor  Fund of Knowledge:Poor  Language:Poor   Psychomotor Activity  Psychomotor Activity: No data recorded  Assets  Assets:Communication Skills; Financial Resources/Insurance; Housing; Vocational/Educational; Social Support   Sleep  Sleep: No data recorded  Physical Exam: Physical Exam Vitals and nursing note reviewed.  Constitutional:      Appearance: Normal appearance.  HENT:     Head: Normocephalic and atraumatic.     Mouth/Throat:     Pharynx: Oropharynx is clear.  Eyes:     Pupils: Pupils are equal, round,  and reactive to light.  Cardiovascular:     Rate and Rhythm: Normal rate and regular rhythm.  Pulmonary:     Effort: Pulmonary effort is normal.     Breath sounds: Normal breath sounds.  Abdominal:     General: Abdomen is flat.     Palpations: Abdomen is soft.  Musculoskeletal:        General: Normal range of motion.  Skin:    General: Skin is warm and dry.  Neurological:     General: No focal deficit present.     Mental Status: She is alert. Mental status is at baseline.  Psychiatric:        Attention and Perception: Attention normal. She perceives auditory hallucinations.        Mood and Affect: Affect is blunt.        Speech: Speech is delayed.        Behavior: Behavior is withdrawn.        Thought Content: Thought content is paranoid. Thought content does not include homicidal or suicidal ideation.        Cognition and Memory: Memory is impaired.   Review of Systems  Constitutional: Negative.   HENT: Negative.    Eyes: Negative.   Respiratory: Negative.    Cardiovascular: Negative.   Gastrointestinal: Negative.   Musculoskeletal: Negative.   Skin: Negative.   Neurological: Negative.   Psychiatric/Behavioral:  Positive for hallucinations and memory loss. Negative for depression, substance abuse and suicidal ideas. The patient is nervous/anxious.   Blood pressure 115/72, pulse (!) 107,  temperature 97.8 F (36.6 C), temperature source Oral, resp. rate 18, height 5' 5.51" (1.664 m), weight 48.5 kg, SpO2 100 %. Body mass index is 17.52 kg/m.  Treatment Plan Summary: Medication management and Plan 20 year old woman is presenting with new onset psychosis.  Looks gradually better day by day tolerating current Risperdal 1 mg twice a day.  Parents updated me with a report that in the 6 weeks prior to her acute break the patient had seemed to be hyper energetic and somewhat euphoric.  They mention it out of concern that this could have represented a mania.  We discussed how this is certainly possible and that definitive diagnosis will be a long-term project.  Nevertheless would not at this point change her treatment plan with the current medicine.  Parents are still discussing the options of flight versus driving to get back to [place].  Everyone agrees that she is not yet ready.  Significant on safety outside the hospital.  Still no beds available today on the psychiatric ward.  Patient remains at the top of the list.  Disposition: Recommend psychiatric Inpatient admission when medically cleared. Supportive therapy provided about ongoing stressors.  Mordecai Rasmussen, MD 01/28/2021 5:35 PM

## 2021-01-29 ENCOUNTER — Encounter: Payer: Self-pay | Admitting: Internal Medicine

## 2021-01-29 NOTE — Consult Note (Signed)
[facility] Face-to-Face Psychiatry Consult   Reason for Consult: Follow-up consult for this 20 year old with new onset psychosis.  Spoke with the patient and also spoke with both of her parents.  Patient says she is feeling a little better and some way.  She says she is still hearing voices but that they are less disturbing.  Her affect is still very flat and she is withdrawn.  She still told her parents she did not feel comfortable with traveling yet.  Parents noticed that she is about the same as what I am describing.  Mood perhaps slightly better but still clearly nowhere near baseline. Referring Physician: Lucianne Muss Patient Identification: Sandra [name] MRN:  [id number] Principal Diagnosis: Psychosis [facility]) Diagnosis:  Principal Problem:   Psychosis (HCC) Active Problems:   Near syncope   Syncope   Malnutrition of moderate degree   Total Time spent with patient: 30 minutes  Subjective:   Sandra [name] is a 20 y.o. female patient admitted with "I am a little better.  HPI: See note above.  This is a 20 year old who presented to the emergency room with new onset psychotic symptoms who has shown some gradual improvement but is still having auditory hallucinations and confusion and dysphoric mood.  Past Psychiatric History: No significant past history really  Risk to Self:   Risk to Others:   Prior Inpatient Therapy:   Prior Outpatient Therapy:    Past Medical History:  Past Medical History:  Diagnosis Date   Medical history non-contributory    No past surgical history on file. Family History: No family history on file. Family Psychiatric  History: None Social History:  Social History   Substance and Sexual Activity  Alcohol Use Not Currently     Social History   Substance and Sexual Activity  Drug Use Yes   Types: Marijuana   Comment: Mushrooms, Delta 8    Social History   Socioeconomic History   Marital status: Single    Spouse name: Not on file   Number of children: Not on file    Years of education: Not on file   Highest education level: Not on file  Occupational History   Not on file  Tobacco Use   Smoking status: Some Days    Packs/day: 0.25    Years: 1.00    Pack years: 0.25    Types: Cigarettes   Smokeless tobacco: Never  Vaping Use   Vaping Use: Some days  Substance and Sexual Activity   Alcohol use: Not Currently   Drug use: Yes    Types: Marijuana    Comment: Mushrooms, Delta 8   Sexual activity: Not Currently  Other Topics Concern   Not on file  Social History Narrative   Not on file   Social Determinants of Health   Financial Resource Strain: Not on file  Food Insecurity: Not on file  Transportation Needs: Not on file  Physical Activity: Not on file  Stress: Not on file  Social Connections: Not on file   Additional Social History:    Allergies:   Allergies  Allergen Reactions   Tobramycin Rash    Had it when a kid    Amoxicillin Rash    Reports rash    Labs:  Results for orders placed or performed during the hospital encounter of 01/25/21 (from the past 48 hour(s))  Glucose, capillary     Status: None   Collection Time: 01/27/21  7:45 PM  Result Value Ref Range   Glucose-Capillary 81 70 -  99 mg/dL    Comment: Glucose reference range applies only to samples taken after fasting for at least 8 hours.    Current Facility-Administered Medications  Medication Dose Route Frequency Provider Last Rate Last Admin   acetaminophen (TYLENOL) tablet 650 mg  650 mg Oral Q6H PRN Mansy, Jan A, MD       Or   acetaminophen (TYLENOL) suppository 650 mg  650 mg Rectal Q6H PRN Mansy, Jan A, MD       enoxaparin (LOVENOX) injection 40 mg  40 mg Subcutaneous QHS Mansy, Jan A, MD   40 mg at 01/28/21 2109   feeding supplement (ENSURE ENLIVE / ENSURE PLUS) liquid 237 mL  237 mL Oral TID BM Gillis Santa, MD   237 mL at 01/29/21 1210   LORazepam (ATIVAN) injection 0.5 mg  0.5 mg Intravenous Q4H PRN Gillis Santa, MD   0.5 mg at 01/27/21 1739    LORazepam (ATIVAN) injection 1 mg  1 mg Intravenous Once PRN Jefferson Fuel, MD       magnesium hydroxide (MILK OF MAGNESIA) suspension 30 mL  30 mL Oral Daily PRN Mansy, Jan A, MD       multivitamin liquid 15 mL  15 mL Oral Daily Gillis Santa, MD       ondansetron [facility]) tablet 4 mg  4 mg Oral Q6H PRN Mansy, Jan A, MD       Or   ondansetron [facility]) injection 4 mg  4 mg Intravenous Q6H PRN Mansy, Jan A, MD       risperiDONE (RISPERDAL M-TABS) disintegrating tablet 1 mg  1 mg Oral BID Jolette Lana, Jackquline Denmark, MD   1 mg at 01/29/21 0906   traZODone (DESYREL) tablet 25 mg  25 mg Oral QHS PRN Mansy, Vernetta Honey, MD       Vitamin D (Ergocalciferol) (DRISDOL) capsule 50,000 Units  50,000 Units Oral Q7 days Gillis Santa, MD   50,000 Units at 01/28/21 1011    Musculoskeletal: Strength & Muscle Tone: within normal limits Gait & Station: normal Patient leans: N/A            Psychiatric Specialty Exam:  Presentation  General Appearance: Appropriate for Environment; Casual  Eye Contact:Fair  Speech:Blocked  Speech Volume:Normal  Handedness:Right   Mood and Affect  Mood: No data recorded Affect:Inappropriate; Full Range   Thought Process  Thought Processes:Disorganized  Descriptions of Associations:Loose  Orientation:Full (Time, Place and Person)  Thought Content:Illogical  History of Schizophrenia/Schizoaffective disorder:No  Duration of Psychotic Symptoms:N/A  Hallucinations:No data recorded Ideas of Reference:None  Suicidal Thoughts:No data recorded Homicidal Thoughts:No data recorded  Sensorium  Memory:Immediate Fair; Recent Poor  Judgment:Impaired  Insight:Lacking   Executive Functions  Concentration:Poor  Attention Span:Poor  Recall:Poor  Fund of Knowledge:Poor  Language:Poor   Psychomotor Activity  Psychomotor Activity: No data recorded  Assets  Assets:Communication Skills; Financial Resources/Insurance; Housing; Vocational/Educational;  Social Support   Sleep  Sleep: No data recorded  Physical Exam: Physical Exam Vitals and nursing note reviewed.  Constitutional:      Appearance: Normal appearance.  HENT:     Head: Normocephalic and atraumatic.     Mouth/Throat:     Pharynx: Oropharynx is clear.  Eyes:     Pupils: Pupils are equal, round, and reactive to light.  Cardiovascular:     Rate and Rhythm: Normal rate and regular rhythm.  Pulmonary:     Effort: Pulmonary effort is normal.     Breath sounds: Normal breath sounds.  Abdominal:  General: Abdomen is flat.     Palpations: Abdomen is soft.  Musculoskeletal:        General: Normal range of motion.  Skin:    General: Skin is warm and dry.  Neurological:     General: No focal deficit present.     Mental Status: She is alert. Mental status is at baseline.  Psychiatric:        Attention and Perception: Attention normal. She perceives auditory hallucinations.        Mood and Affect: Mood normal. Affect is blunt.        Speech: Speech is delayed.        Behavior: Behavior is slowed.        Thought Content: Thought content is paranoid. Thought content does not include suicidal ideation.   Review of Systems  Constitutional: Negative.   HENT: Negative.    Eyes: Negative.   Respiratory: Negative.    Cardiovascular: Negative.   Gastrointestinal: Negative.   Musculoskeletal: Negative.   Skin: Negative.   Neurological: Negative.   Psychiatric/Behavioral:  Positive for hallucinations. Negative for suicidal ideas.   Blood pressure 104/69, pulse (!) 101, temperature 97.8 F (36.6 C), temperature source Oral, resp. rate 17, height 5' 5.51" (1.664 m), weight 48.5 kg, SpO2 100 %. Body mass index is 17.52 kg/m.  Treatment Plan Summary: Plan spoke with the parents and suggested that we make a tentative plan for discharging her Monday.  I talked with the patient about this and she feels like that is a possibility.  Encouraged her to continue with medicine  continued getting up out of bed walking.  Spoke with parents about longer term plan after discharge.  Still no beds available on the inpatient psychiatric ward at this time.  Continue management as she is.  Disposition: Recommend psychiatric Inpatient admission when medically cleared.  Mordecai Rasmussen, MD 01/29/2021 3:26 PM

## 2021-01-29 NOTE — Progress Notes (Signed)
[facility] Progress Note  Patient: Sandra [name]    KNL:[id number]  DOA: 01/25/2021     Date of Service: the patient was seen and examined on 01/29/2021  No chief complaint on file.  Brief hospital course: Sandra [name] is a 20 y.o. 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  She denies any falls or head injuries.  She was assisted to the floor.  She was incontinent of urine as she was being assisted to the floor by IPR.  Her Peer reported that her eyes rolled back in her head that she started to go down when he caught her.  She has been having nausea earlier and was given some crackers and ginger ale.  She has been tearful and confused not knowing why she is in the behavioral unit.  Her blood glucose was 99 and blood pressure was 120/72 with a heart rate of 94 and respiratory rate of 16 and pulse oximetry 100% on room air.  The patient was fairly somnolent and a very poor historian during my interview.  She was fairly confused.  She denies any previous history of seizures.  She denies any paresthesias or focal muscle weakness.  No dysuria, oliguria, urinary frequency or urgency or flank pain.  Upon arrival to the medical floor her blood pressure was 117/858 with a heart rate of 62 respiratory rate of 16 temperature 97.8 and pulse oximetry 100% on room air.   EKG as reviewed by me : Showed normal sinus rhythm with a rate of 89 with right axis deviation and right atrial enlargement. Imaging: Stat head CT scan without contrast is currently pending.  Stat portable chest x-ray is currently pending.  Stat labs are currently pending.  On 9/10 her CMP was remarkable for an albumin of 5.3 and total protein of 8.2 CBC was within normal.  Influenza antigens and COVID-19 PCR came back negative.  Tylenol level was less than 10 and  salicylate less than 7.  UA showed many bacteria with 6-10 WBCs and more than 160 ketones with trace leukocytes.  Urine pregnancy test was negative.  The patient will be admitted to an observation medically monitored bed for further evaluation and management.   Assessment and Plan: Active Problems:   Near syncope   # Syncope with associated urinary incontinence, rule out seizures. -The patient will be admitted to an observation medically monitored bed. - Orthostatics positive, blood pressure dropped but patient remained asymptomatic --TTE shows LVEF 55%, no any other significant findings --CT head no acute findings --Continue IV fluid for hydration, continue monitor on telemetry for arrhythmias.   -- Patient was seen by neurology, recommended no EEG at this time. - TSH level 1.6 wnl Neurology ordered CT head with and without contrast, which did not show any acute CNS findings  Patient was cleared by neurology point of view to discharge  # Generalized anxiety disorder. - d/c;d Cymbalta home med - continue as needed IV Ativan.   #  Brief reactive psychosis. - Continue Risperdal 1 mg p.o. twice daily  -- Psych consulted, patient is a still under IVC, not cleared to be discharged.  Patient may need to be transferred to behavioral health unit but no beds available today. Continue to follow psych for further recommendation Trazodone 25 mg p.o. nightly as needed for sleep was continued  #  Recent bacteriuria. -  Patient denies any symptoms of dysuria --Urine culture <10 K, no further work-up needed - s/p IV Rocephin for 5 days, completed on 9/17    Diet: Regular diet DVT Prophylaxis: Subcutaneous Lovenox   Advance goals of care discussion: Full code  Family Communication: family was  present at bedside, at the time of interview.  The pt provided permission to discuss medical plan with the family. Opportunity was given to ask question and all questions were answered satisfactorily.    Disposition:  Pt is from psych unit, admitted with syncopal episode, patient has orthostatic hypotension, on IV fluid for hydration, which precludes a safe discharge. Discharge to psych unit, when bed will be available at [facility], no beds today.  Awaiting psych if patient is stable from their point of view to discharge.  Subjective: No significant overnight issues, slept well, patient still seems to be little confused, as she asked how to control the volume of the TV as she had TV remote and she was pointing to diet and phone as well which went to use to change the volume.  Patient denied any other active issues    Physical Exam: General:  alert oriented to time, place, and person.  Appear in mild distress, affect depressed Eyes: PERRLA ENT: Oral Mucosa Clear, moist  Neck: no JVD,  Cardiovascular: S1 and S2 Present, no Murmur,  Respiratory: good respiratory effort, Bilateral Air entry equal and Decreased, no Crackles, no wheezes Abdomen: Bowel Sound present, Soft and no tenderness,  Skin: no rashes Extremities: no Pedal edema, no calf tenderness Neurologic: without any new focal findings Gait not checked due to patient safety concerns  Vitals:   01/28/21 2315 01/29/21 0645 01/29/21 0750 01/29/21 1130  BP: 112/70 (!) 117/57 (!) 105/59 104/69  Pulse: 86 79 88 (!) 101  Resp: 16 16 17 17   Temp: 97.9 F (36.6 C) 98.2 F (36.8 C) 98.1 F (36.7 C) 97.8 F (36.6 C)  TempSrc: Oral Oral Oral Oral  SpO2: 100% 100% 100% 100%  Weight:      Height:        Intake/Output Summary (Last 24 hours) at 01/29/2021 1420 Last data filed at 01/29/2021 1300 Gross per 24 hour  Intake 1558.94 ml  Output --  Net 1558.94 ml   Filed Weights   01/26/21 2152  Weight: 48.5 kg    Data Reviewed: I have personally reviewed and interpreted daily labs, tele strips, imagings as discussed above. I reviewed all nursing notes, pharmacy notes, vitals, pertinent old records I have discussed plan of care as  described above with RN and patient/family.  CBC: Recent Labs  Lab 01/22/21 1709 01/25/21 0151 01/25/21 0354  WBC 8.5 6.0 5.7  NEUTROABS 6.5 3.9  --   HGB 15.0 14.5 13.2  HCT 42.5 41.0 37.8  MCV 81.7 82.3 83.4  PLT 370 304 271   Basic Metabolic Panel: Recent Labs  Lab 01/22/21 1703 01/25/21 0151 01/25/21 0354 01/26/21 1000  NA 139 137 138  --   K 4.1 3.7 3.8  --   CL 107 101 105  --   CO2 20* 24 25  --   GLUCOSE 88 85 87  --   BUN 10 11 12   --   CREATININE 0.65 0.66 0.58  --   CALCIUM 9.9 9.9 9.1  --   MG  --   --   --  1.8  PHOS  --   --   --  3.1    Studies: No results found.  Scheduled Meds:  enoxaparin (LOVENOX) injection  40 mg Subcutaneous QHS   feeding supplement  237 mL Oral TID BM   multivitamin  15 mL Oral Daily   risperiDONE  1 mg Oral BID   Vitamin D (Ergocalciferol)  50,000 Units Oral Q7 days   Continuous Infusions:  cefTRIAXone (ROCEPHIN)  IV 1 g (01/29/21 0407)   PRN Meds: acetaminophen **OR** acetaminophen, LORazepam, LORazepam, magnesium hydroxide, ondansetron **OR** ondansetron (ZOFRAN) IV, traZODone  Time spent: 35 minutes  Author: Gillis Santa. MD [facility] 01/29/2021 2:20 PM  To reach On-call, see care teams to locate the attending and reach out to them via [email or web address]. If 7PM-7AM, please contact night-coverage If you still have difficulty reaching the attending provider, please page the [facility] (Director on Call) for [facility] on amion for assistance.

## 2021-01-30 MED ORDER — RISPERIDONE 1 MG PO TBDP
1.0000 mg | ORAL_TABLET | Freq: Two times a day (BID) | ORAL | 1 refills | Status: DC
Start: 2021-01-30 — End: 2021-01-31

## 2021-01-30 MED ORDER — LORAZEPAM 0.5 MG PO TABS: 0.5000 mg | ORAL_TABLET | Freq: Four times a day (QID) | ORAL | 0 refills | Status: DC | PRN

## 2021-01-30 NOTE — Progress Notes (Signed)
[facility] Progress Note  Patient: Sandra [name]    JKK:[id number]  DOA: 01/25/2021     Date of Service: the patient was seen and examined on 01/30/2021  No chief complaint on file.  Brief hospital course: Sandra [name] is a 20 y.o. 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  She denies any falls or head injuries.  She was assisted to the floor.  She was incontinent of urine as she was being assisted to the floor by IPR.  Her Peer reported that her eyes rolled back in her head that she started to go down when he caught her.  She has been having nausea earlier and was given some crackers and ginger ale.  She has been tearful and confused not knowing why she is in the behavioral unit.  Her blood glucose was 99 and blood pressure was 120/72 with a heart rate of 94 and respiratory rate of 16 and pulse oximetry 100% on room air.  The patient was fairly somnolent and a very poor historian during my interview.  She was fairly confused.  She denies any previous history of seizures.  She denies any paresthesias or focal muscle weakness.  No dysuria, oliguria, urinary frequency or urgency or flank pain.  Upon arrival to the medical floor her blood pressure was 117/858 with a heart rate of 62 respiratory rate of 16 temperature 97.8 and pulse oximetry 100% on room air.   EKG as reviewed by me : Showed normal sinus rhythm with a rate of 89 with right axis deviation and right atrial enlargement. Imaging: Stat head CT scan without contrast is currently pending.  Stat portable chest x-ray is currently pending.  Stat labs are currently pending.  On 9/10 her CMP was remarkable for an albumin of 5.3 and total protein of 8.2 CBC was within normal.  Influenza antigens and COVID-19 PCR came back negative.  Tylenol level was less than 10 and  salicylate less than 7.  UA showed many bacteria with 6-10 WBCs and more than 160 ketones with trace leukocytes.  Urine pregnancy test was negative.  The patient will be admitted to an observation medically monitored bed for further evaluation and management.   Assessment and Plan: Active Problems:   Near syncope   # Syncope with associated urinary incontinence, rule out seizures. -The patient will be admitted to an observation medically monitored bed. - Orthostatics positive, blood pressure dropped but patient remained asymptomatic --TTE shows LVEF 55%, no any other significant findings --CT head no acute findings --s/p IV fluid for hydration, continue monitor on telemetry for arrhythmias.   -- Patient was seen by neurology, recommended no EEG at this time. - TSH level 1.6 wnl Neurology ordered CT head with and without contrast, which did not show any acute CNS findings  Patient was cleared by neurology point of view to discharge  # Generalized anxiety disorder. - d/c;d Cymbalta home med - continue as needed IV Ativan.   #  Brief reactive psychosis. - Continue Risperdal 1 mg p.o. twice daily  -- Psych consulted, patient is a still under IVC, not cleared to be discharged.  Patient may need to be transferred to behavioral health unit but no beds available at this time Trazodone 25 mg p.o. nightly as needed for sleep was continued As per psych tentative plan is to discharge her tomorrow a.m.  on Monday so that she can travel back home with parents. Continue to follow psych for further recommendation   #  Recent bacteriuria. - Patient denies any symptoms of dysuria --Urine culture <10 K, no further work-up needed - s/p IV Rocephin for 5 days, completed on 9/17    Diet: Regular diet DVT Prophylaxis: Subcutaneous Lovenox   Advance goals of care discussion: Full code  Family Communication: family was  present at bedside, at the time of interview.  The pt provided permission to  discuss medical plan with the family. Opportunity was given to ask question and all questions were answered satisfactorily.   Disposition:  Pt is from psych unit, admitted with syncopal episode, patient has orthostatic hypotension, on IV fluid for hydration, which precludes a safe discharge. Discharge to psych unit, when bed will be available at [facility], no beds today.  Awaiting psych if patient is stable from their point of view to discharge.  Subjective: No significant overnight issues, slept well, patient was watching movie on iPad.  Patient seems to be more alert today, did not ask any questions.  Patient denied any concerns and no active issues at this time.  Patient stated that she is considering to go back home with parents tomorrow a.m. as per conversation with psych.   Physical Exam: General:  alert oriented to time, place, and person.  Appear in mild distress, affect depressed Eyes: PERRLA ENT: Oral Mucosa Clear, moist  Neck: no JVD,  Cardiovascular: S1 and S2 Present, no Murmur,  Respiratory: good respiratory effort, Bilateral Air entry equal and Decreased, no Crackles, no wheezes Abdomen: Bowel Sound present, Soft and no tenderness,  Skin: no rashes Extremities: no Pedal edema, no calf tenderness Neurologic: without any new focal findings Gait not checked due to patient safety concerns  Vitals:   01/29/21 1130 01/29/21 1750 01/29/21 2002 01/30/21 0617  BP: 104/69 112/60 108/66 (!) 103/57  Pulse: (!) 101 (!) 110 84 95  Resp: 17 17 16 17   Temp: 97.8 F (36.6 C)  98.1 F (36.7 C) 97.9 F (36.6 C)  TempSrc: Oral  Oral Oral  SpO2: 100% 100% 100% 100%  Weight:      Height:        Intake/Output Summary (Last 24 hours) at 01/30/2021 1158 Last data filed at 01/29/2021 1300 Gross per 24 hour  Intake 0 ml  Output --  Net 0 ml   Filed Weights   01/26/21 2152  Weight: 48.5 kg    Data Reviewed: I have personally reviewed and interpreted daily labs, tele strips, imagings as  discussed above. I reviewed all nursing notes, pharmacy notes, vitals, pertinent old records I have discussed plan of care as described above with RN and patient/family.  CBC: Recent Labs  Lab 01/25/21 0151 01/25/21 0354  WBC 6.0 5.7  NEUTROABS 3.9  --   HGB 14.5 13.2  HCT 41.0 37.8  MCV 82.3 83.4  PLT 304 271   Basic Metabolic Panel: Recent Labs  Lab 01/25/21 0151 01/25/21 0354 01/26/21 1000  NA 137 138  --   K 3.7 3.8  --   CL 101 105  --   CO2 24 25  --   GLUCOSE 85 87  --   BUN 11 12  --   CREATININE 0.66 0.58  --   CALCIUM 9.9 9.1  --   MG  --   --  1.8  PHOS  --   --  3.1    Studies: No results found.  Scheduled Meds:  enoxaparin (LOVENOX) injection  40 mg Subcutaneous QHS   feeding supplement  237 mL Oral TID BM   multivitamin  15 mL Oral Daily   risperiDONE  1 mg Oral BID   Vitamin D (Ergocalciferol)  50,000 Units Oral Q7 days   Continuous Infusions:   PRN Meds: acetaminophen **OR** acetaminophen, LORazepam, LORazepam, magnesium hydroxide, ondansetron **OR** ondansetron (ZOFRAN) IV, traZODone  Time spent: 35 minutes  Author: Gillis Santa. MD [facility] 01/30/2021 11:58 AM  To reach On-call, see care teams to locate the attending and reach out to them via [email or web address]. If 7PM-7AM, please contact night-coverage If you still have difficulty reaching the attending provider, please page the [facility] (Director on Call) for [facility] on amion for assistance.

## 2021-01-30 NOTE — Consult Note (Signed)
[facility] Face-to-Face Psychiatry Consult   Reason for Consult: Follow-up consult 20 year old woman who has been admitted to the hospital after spell of new onset psychosis Referring Physician: Lucianne Muss Patient Identification: Sandra [name] MRN:  [id number] Principal Diagnosis: Psychosis [facility]) Diagnosis:  Principal Problem:   Psychosis (HCC) Active Problems:   Near syncope   Syncope   Malnutrition of moderate degree   Total Time spent with patient: 30 minutes  Subjective:   Sandra [name] is a 20 y.o. female patient admitted with "I am feeling better".  HPI: Spoke with patient also with both parents were present.  Patient says she is feeling better.  Says auditory hallucinations are not present today.  Mood feeling more stable.  No report of any dangerous cognition.  Patient is oriented and has had no major behavior problems on the unit.  We discussed the tentative plan that she be discharged tomorrow by 1:00 so that she and her family can fly back to [place].  Reviewed the stresses involved.  Patient says she feels safe with the plan.  Past Psychiatric History: No significant history other than some therapy prior to the current episode  Risk to Self:   Risk to Others:   Prior Inpatient Therapy:   Prior Outpatient Therapy:    Past Medical History:  Past Medical History:  Diagnosis Date   Medical history non-contributory    No past surgical history on file. Family History: No family history on file. Family Psychiatric  History: None reported Social History:  Social History   Substance and Sexual Activity  Alcohol Use Not Currently     Social History   Substance and Sexual Activity  Drug Use Yes   Types: Marijuana   Comment: Mushrooms, Delta 8    Social History   Socioeconomic History   Marital status: Single    Spouse name: Not on file   Number of children: Not on file   Years of education: Not on file   Highest education level: Not on file  Occupational History   Not on  file  Tobacco Use   Smoking status: Some Days    Packs/day: 0.25    Years: 1.00    Pack years: 0.25    Types: Cigarettes   Smokeless tobacco: Never  Vaping Use   Vaping Use: Some days  Substance and Sexual Activity   Alcohol use: Not Currently   Drug use: Yes    Types: Marijuana    Comment: Mushrooms, Delta 8   Sexual activity: Not Currently  Other Topics Concern   Not on file  Social History Narrative   Not on file   Social Determinants of Health   Financial Resource Strain: Not on file  Food Insecurity: Not on file  Transportation Needs: Not on file  Physical Activity: Not on file  Stress: Not on file  Social Connections: Not on file   Additional Social History:    Allergies:   Allergies  Allergen Reactions   Tobramycin Rash    Had it when a kid    Amoxicillin Rash    Reports rash    Labs: No results found for this or any previous visit (from the past 48 hour(s)).  Current Facility-Administered Medications  Medication Dose Route Frequency Provider Last Rate Last Admin   acetaminophen (TYLENOL) tablet 650 mg  650 mg Oral Q6H PRN Mansy, Jan A, MD       Or   acetaminophen (TYLENOL) suppository 650 mg  650 mg Rectal Q6H PRN Mansy, Jan A,  MD       enoxaparin (LOVENOX) injection 40 mg  40 mg Subcutaneous QHS Mansy, Jan A, MD   40 mg at 01/29/21 2046   feeding supplement (ENSURE ENLIVE / ENSURE PLUS) liquid 237 mL  237 mL Oral TID BM Gillis Santa, MD   237 mL at 01/30/21 0937   LORazepam (ATIVAN) injection 0.5 mg  0.5 mg Intravenous Q4H PRN Gillis Santa, MD   0.5 mg at 01/27/21 1739   LORazepam (ATIVAN) injection 1 mg  1 mg Intravenous Once PRN Jefferson Fuel, MD       magnesium hydroxide (MILK OF MAGNESIA) suspension 30 mL  30 mL Oral Daily PRN Mansy, Jan A, MD       multivitamin liquid 15 mL  15 mL Oral Daily Gillis Santa, MD       ondansetron [facility]) tablet 4 mg  4 mg Oral Q6H PRN Mansy, Jan A, MD   4 mg at 01/29/21 1628   Or   ondansetron (ZOFRAN)  injection 4 mg  4 mg Intravenous Q6H PRN Mansy, Jan A, MD       risperiDONE (RISPERDAL M-TABS) disintegrating tablet 1 mg  1 mg Oral BID Rodnisha Blomgren, Jackquline Denmark, MD   1 mg at 01/30/21 6387   traZODone (DESYREL) tablet 25 mg  25 mg Oral QHS PRN Mansy, Vernetta Honey, MD       Vitamin D (Ergocalciferol) (DRISDOL) capsule 50,000 Units  50,000 Units Oral Q7 days Gillis Santa, MD   50,000 Units at 01/28/21 1011    Musculoskeletal: Strength & Muscle Tone: within normal limits Gait & Station: normal Patient leans: N/A            Psychiatric Specialty Exam:  Presentation  General Appearance: Appropriate for Environment; Casual  Eye Contact:Fair  Speech:Blocked  Speech Volume:Normal  Handedness:Right   Mood and Affect  Mood: No data recorded Affect:Inappropriate; Full Range   Thought Process  Thought Processes:Disorganized  Descriptions of Associations:Loose  Orientation:Full (Time, Place and Person)  Thought Content:Illogical  History of Schizophrenia/Schizoaffective disorder:No  Duration of Psychotic Symptoms:N/A  Hallucinations:No data recorded Ideas of Reference:None  Suicidal Thoughts:No data recorded Homicidal Thoughts:No data recorded  Sensorium  Memory:Immediate Fair; Recent Poor  Judgment:Impaired  Insight:Lacking   Executive Functions  Concentration:Poor  Attention Span:Poor  Recall:Poor  Fund of Knowledge:Poor  Language:Poor   Psychomotor Activity  Psychomotor Activity: No data recorded  Assets  Assets:Communication Skills; Financial Resources/Insurance; Housing; Vocational/Educational; Social Support   Sleep  Sleep: No data recorded  Physical Exam: Physical Exam Vitals and nursing note reviewed.  Constitutional:      Appearance: Normal appearance.  HENT:     Head: Normocephalic and atraumatic.     Mouth/Throat:     Pharynx: Oropharynx is clear.  Eyes:     Pupils: Pupils are equal, round, and reactive to light.  Cardiovascular:      Rate and Rhythm: Normal rate and regular rhythm.  Pulmonary:     Effort: Pulmonary effort is normal.     Breath sounds: Normal breath sounds.  Abdominal:     General: Abdomen is flat.     Palpations: Abdomen is soft.  Musculoskeletal:        General: Normal range of motion.  Skin:    General: Skin is warm and dry.  Neurological:     General: No focal deficit present.     Mental Status: She is alert. Mental status is at baseline.  Psychiatric:        Attention  and Perception: Attention normal. She does not perceive auditory hallucinations.        Mood and Affect: Mood normal. Affect is blunt.        Speech: Speech is delayed.        Behavior: Behavior is slowed.        Thought Content: Thought content normal. Thought content is not paranoid. Thought content does not include homicidal or suicidal ideation.   Review of Systems  Constitutional: Negative.   HENT: Negative.    Eyes: Negative.   Respiratory: Negative.    Cardiovascular: Negative.   Gastrointestinal: Negative.   Musculoskeletal: Negative.   Skin: Negative.   Neurological: Negative.   Psychiatric/Behavioral:  Negative for depression, hallucinations, substance abuse and suicidal ideas. The patient is not nervous/anxious.   Blood pressure (!) 103/57, pulse 95, temperature 97.9 F (36.6 C), temperature source Oral, resp. rate 17, height 5' 5.51" (1.664 m), weight 48.5 kg, SpO2 100 %. Body mass index is 17.52 kg/m.  Treatment Plan Summary: Medication management and Plan I reviewed the plan with the patient and parents.  Parents are working on a plan to enroll her in partial hospital program when they arrived home.  I explained this to the patient and explained the importance of medication use.  Explained to her that future providers may change her medicines and that is fine.  Reviewed current dosage.  I have put in prescriptions for risperidone 1 mg twice a day as well as lorazepam 0.  5 mg every 6 hours as needed both  oral both month supplies to the local [facility] such that the family can pick up their prescriptions today and be prepared for discharge tomorrow.  Communicated with current inpatient treatment team.  Disposition: Supportive therapy provided about ongoing stressors. Discussed crisis plan, support from social network, calling 911, coming to the Emergency Department, and calling Suicide Hotline.  Mordecai Rasmussen, MD 01/30/2021 1:23 PM

## 2021-01-31 ENCOUNTER — Other Ambulatory Visit: Payer: Self-pay | Admitting: Psychiatry

## 2021-01-31 DIAGNOSIS — E440 Moderate protein-calorie malnutrition: Secondary | ICD-10-CM

## 2021-01-31 MED ORDER — LORAZEPAM 0.5 MG PO TABS: 0.5000 mg | ORAL_TABLET | Freq: Four times a day (QID) | ORAL | Status: DC | PRN

## 2021-01-31 MED ORDER — VITAMIN D (ERGOCALCIFEROL) 1.25 MG (50000 UNIT) PO CAPS: 50000.0000 [IU] | ORAL_CAPSULE | ORAL | 0 refills | Status: AC

## 2021-01-31 MED ORDER — LORAZEPAM 0.5 MG PO TABS: 0.5000 mg | ORAL_TABLET | Freq: Four times a day (QID) | ORAL | 0 refills | Status: AC | PRN

## 2021-01-31 MED ORDER — RISPERIDONE 1 MG PO TBDP: 1.0000 mg | ORAL_TABLET | Freq: Two times a day (BID) | ORAL | 1 refills | Status: DC

## 2021-01-31 MED ORDER — TRAZODONE HCL 50 MG PO TABS: 25.0000 mg | ORAL_TABLET | Freq: Every evening | ORAL | 0 refills | Status: DC | PRN

## 2021-01-31 NOTE — Discharge Summary (Signed)
Physician Discharge Summary  Georjean Toya ZOX:[id number] DOB: 26-Sep-2000 DOA: 01/25/2021  PCP: Oneita Hurt, No  Admit date: 01/25/2021 Discharge date: 01/31/2021  Admitted From: Home  Discharge disposition: Home  Recommendations for Outpatient Follow-Up:   Follow up with your primary care provider in one week.  Check CBC, BMP, magnesium in the next visit Follow-up with psychiatry as outpatient.  Discharge Diagnosis:   Principal Problem:   Psychosis (HCC) Active Problems:   Near syncope   Syncope   Malnutrition of moderate degree   Discharge Condition: Improved.  Diet recommendation: Regular  Wound care: None.  Code status: Full.   History of Present Illness:   Starnisha Batrez is a 20 y.o. 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  She denied any falls or head injuries.  She was assisted to the floor.  She was incontinent of urine as she was being assisted to the floor by IPR.  Her Peer reported that her eyes rolled back in her head that she started to go down when he caught her.  Upon arrival to the medical floor, her blood pressure was 117/858 with a heart rate of 62/min, respiratory rate of 16 temperature 97.8 and pulse oximetry 100% on room air.  EKG showed normal sinus rhythm.  CT head scan was unremarkable.  CBC was within normal.  Influenza antigens and COVID-19 PCR came back negative.  Tylenol level was less than 10 and salicylate less than 7.  Urinalysis showed many bacteria with 6-10/ WBCs and more than 160 ketones with trace leukocytes.  Urine pregnancy test was negative.  Patient was then admitted to hospital for further evaluation and treatment.  Hospital Course:   Following conditions were addressed during hospitalization as listed below,  Syncope with associated urinary incontinence, rule out  seizures. Patient had positive orthostatic blood pressure initially and received IV fluid hydration with improvement.  Transthoracic echocardiogram showed LV ejection fraction of 55%.  Patient was also seen by a neurologist but did not recommend EEG.  CT head scan did not show any acute findings.  Patient was cleared from neurology point of view.  Generalized anxiety disorder. Was on Cymbalta.  Continue Ativan on discharge.   Brief reactive psychosis. Continue Risperdal 1 mg p.o. twice daily and Ativan as per psychiatry recommendation.  As needed trazodone at nighttime.  Recent bacteriuria with possible UTI. Pleated 5-day course of IV Rocephin.  Urine culture showed insignificant colonies.  Disposition.  At this time, patient is stable for disposition home with outpatient PCP and psychiatry follow-up  Medical Consultants:   Psychiatry  Procedures:    None Subjective:   Today, patient was seen and examined at bedside.  Feels better today.  Denies any dizziness lightheadedness shortness of breath chest pain.  Discharge Exam:   Vitals:   01/31/21 0631 01/31/21 0752  BP: (!) 101/53 (!) 97/56  Pulse: 75 84  Resp: 16 16  Temp: 97.7 F (36.5 C) 98.2 F (36.8 C)  SpO2: 100% 100%   Vitals:   01/30/21 1610 01/30/21 1926 01/31/21 0631 01/31/21 0752  BP: 110/67 103/60 (!) 101/53 (!) 97/56  Pulse: 88 83 75 84  Resp: 17 16 16 16   Temp: 98.3 F (36.8 C) 98.1 F (36.7 C) 97.7 F (36.5 C) 98.2 F (36.8 C)  TempSrc: Oral  Oral   SpO2: 100% 100% 100% 100%  Weight:  Height:       General: Alert awake, not in obvious distress HENT: pupils equally reacting to light,  No scleral pallor or icterus noted. Oral mucosa is moist.  Chest:  Clear breath sounds.  Diminished breath sounds bilaterally. No crackles or wheezes.  CVS: S1 &S2 heard. No murmur.  Regular rate and rhythm. Abdomen: Soft, nontender, nondistended.  Bowel sounds are heard.   Extremities: No cyanosis, clubbing or  edema.  Peripheral pulses are palpable. Psych: Alert, awake and oriented, flat affect CNS:  No cranial nerve deficits.  Power equal in all extremities.   Skin: Warm and dry.  No rashes noted.  The results of significant diagnostics from this hospitalization (including imaging, microbiology, ancillary and laboratory) are listed below for reference.     Diagnostic Studies:   CT HEAD WO CONTRAST ( )  Result Date: 01/25/2021 CLINICAL DATA:  Simple syncope with abnormal neuro exam EXAM: CT HEAD WITHOUT CONTRAST TECHNIQUE: Contiguous axial images were obtained from the base of the skull through the vertex without intravenous contrast. COMPARISON:  None. FINDINGS: Brain: No evidence of acute infarction, hemorrhage, hydrocephalus, extra-axial collection or mass lesion/mass effect. Vascular: No hyperdense vessel or unexpected calcification. Skull: Normal. Negative for fracture or focal lesion. Sinuses/Orbits: Retention cyst appearance in the right maxillary sinus. IMPRESSION: Normal appearance of the brain. Electronically Signed   By: Tiburcio Pea M.D.   On: 01/25/2021 11:24   CT HEAD W & WO CONTRAST ( )  Result Date: 01/26/2021 CLINICAL DATA:  Mental status change.  Psychosis. EXAM: CT HEAD WITHOUT AND WITH CONTRAST TECHNIQUE: Contiguous axial images were obtained from the base of the skull through the vertex without and with intravenous contrast CONTRAST:  40mL OMNIPAQUE IOHEXOL 350 MG/ML SOLN COMPARISON:  CT head 01/25/2021 FINDINGS: Brain: No evidence of acute infarction, hemorrhage, hydrocephalus, extra-axial collection or mass lesion/mass effect. Normal enhancement following contrast administration Vascular: Negative for hyperdense vessel. Normal arterial and venous enhancement. Skull: Negative Sinuses/Orbits: Mild mucosal edema right maxillary sinus. Remaining sinuses clear. Negative orbit Other: None IMPRESSION: Normal CT of the brain with contrast. Electronically Signed   By: Marlan Palau  M.D.   On: 01/26/2021 18:54   ECHOCARDIOGRAM COMPLETE  Result Date: 01/25/2021    ECHOCARDIOGRAM REPORT   Patient Name:   Naryah Coalson Date of Exam: 01/25/2021 Medical Rec #:  [id number]   Height:       65.5 in Accession #:    [id number]  Weight:       107.0 lb Date of Birth:  2001-01-16    BSA:          1.525 m Patient Age:    20 years    BP:           106/59 mmHg Patient Gender: F           HR:           85 bpm. Exam Location:  [facility] Procedure: 2D Echo, Color Doppler and Cardiac Doppler Indications:     Syncope R55  History:         Patient has no prior history of Echocardiogram examinations.                  Medical history non-contributory.  Sonographer:     Cristela Blue Referring Phys:  [id number] JAN A MANSY Diagnosing Phys: Yvonne Kendall MD  Sonographer Comments: Technically difficult study due to poor echo windows. Image acquisition challenging due to uncooperative patient and Image acquisition challenging due to patient body  habitus. IMPRESSIONS  1. Left ventricular ejection fraction, by estimation, is >55%. The left ventricle has normal function. Left ventricular endocardial border not optimally defined to evaluate regional wall motion. Left ventricular diastolic function could not be evaluated.  2. Right ventricular systolic function was not well visualized. The right ventricular size is not well visualized.  3. The mitral valve is normal in structure. No evidence of mitral valve regurgitation.  4. The aortic valve was not well visualized. FINDINGS  Left Ventricle: Left ventricular ejection fraction, by estimation, is >55%. The left ventricle has normal function. Left ventricular endocardial border not optimally defined to evaluate regional wall motion. The left ventricular internal cavity size was  normal in size. There is no left ventricular hypertrophy. Left ventricular diastolic function could not be evaluated. Right Ventricle: The right ventricular size is not well visualized. Right vetricular wall  thickness was not assessed. Right ventricular systolic function was not well visualized. Left Atrium: Left atrial size was not assessed. Right Atrium: Right atrial size was not assessed. Pericardium: The pericardium was not well visualized. Mitral Valve: The mitral valve is normal in structure. No evidence of mitral valve regurgitation. Tricuspid Valve: The tricuspid valve is normal in structure. Tricuspid valve regurgitation is not demonstrated. Aortic Valve: The aortic valve was not well visualized. Pulmonic Valve: The pulmonic valve was not well visualized. Aorta: The aortic root is normal in size and structure. Venous: The inferior vena cava was not well visualized. IAS/Shunts: The interatrial septum was not assessed.  LEFT VENTRICLE PLAX 2D LVIDd:         2.91 cm LVIDs:         2.14 cm LV PW:         1.11 cm LV IVS:        0.62 cm LVOT diam:     2.00 cm LVOT Area:     3.14 cm  LEFT ATRIUM         Index LA diam:    1.70 cm 1.11 cm/m   AORTA Ao Root diam: 2.60 cm  SHUNTS Systemic Diam: 2.00 cm Yvonne Kendall MD Electronically signed by Yvonne Kendall MD Signature Date/Time: 01/25/2021/4:47:08 PM    Final      Labs:   Basic Metabolic Panel: Recent Labs  Lab 01/25/21 0151 01/25/21 0354 01/26/21 1000  NA 137 138  --   K 3.7 3.8  --   CL 101 105  --   CO2 24 25  --   GLUCOSE 85 87  --   BUN 11 12  --   CREATININE 0.66 0.58  --   CALCIUM 9.9 9.1  --   MG  --   --  1.8  PHOS  --   --  3.1   GFR Estimated Creatinine Clearance: 86.6 mL/min (by C-G formula based on SCr of 0.58 mg/dL). Liver Function Tests: Recent Labs  Lab 01/25/21 0151  AST 22  ALT 16  ALKPHOS 19*  BILITOT 2.1*  PROT 7.2  ALBUMIN 4.5   No results for input(s): LIPASE, AMYLASE in the last 168 hours. No results for input(s): AMMONIA in the last 168 hours. Coagulation profile No results for input(s): INR, PROTIME in the last 168 hours.  CBC: Recent Labs  Lab 01/25/21 0151 01/25/21 0354  WBC 6.0 5.7   NEUTROABS 3.9  --   HGB 14.5 13.2  HCT 41.0 37.8  MCV 82.3 83.4  PLT 304 271   Cardiac Enzymes: No results for input(s): CKTOTAL, CKMB, CKMBINDEX, TROPONINI  in the last 168 hours. BNP: Invalid input(s): POCBNP CBG: Recent Labs  Lab 01/24/21 2232 01/27/21 1945  GLUCAP 99 81   D-Dimer No results for input(s): DDIMER in the last 72 hours. Hgb A1c No results for input(s): HGBA1C in the last 72 hours. Lipid Profile No results for input(s): CHOL, HDL, LDLCALC, TRIG, CHOLHDL, LDLDIRECT in the last 72 hours. Thyroid function studies No results for input(s): TSH, T4TOTAL, T3FREE, THYROIDAB in the last 72 hours.  Invalid input(s): FREET3 Anemia work up No results for input(s): VITAMINB12, FOLATE, FERRITIN, TIBC, IRON, RETICCTPCT in the last 72 hours. Microbiology Recent Results (from the past 240 hour(s))  Resp Panel by RT-PCR (Flu A&B, Covid) Nasopharyngeal Swab     Status: None   Collection Time: 01/24/21 10:55 AM   Specimen: Nasopharyngeal Swab; Nasopharyngeal(NP) swabs in vial transport medium  Result Value Ref Range Status   SARS Coronavirus 2 by RT PCR NEGATIVE NEGATIVE Final    Comment: (NOTE) SARS-CoV-2 target nucleic acids are NOT DETECTED.  The SARS-CoV-2 RNA is generally detectable in upper respiratory specimens during the acute phase of infection. The lowest concentration of SARS-CoV-2 viral copies this assay can detect is 138 copies/mL. A negative result does not preclude SARS-Cov-2 infection and should not be used as the sole basis for treatment or other patient management decisions. A negative result may occur with  improper specimen collection/handling, submission of specimen other than nasopharyngeal swab, presence of viral mutation(s) within the areas targeted by this assay, and inadequate number of viral copies(<138 copies/mL). A negative result must be combined with clinical observations, patient history, and epidemiological information. The expected  result is Negative.  Fact Sheet for Patients:  BloggerCourse.com  Fact Sheet for Healthcare Providers:  SeriousBroker.it  This test is no t yet approved or cleared by the [place] FDA and  has been authorized for detection and/or diagnosis of SARS-CoV-2 by FDA under an Emergency Use Authorization (EUA). This EUA will remain  in effect (meaning this test can be used) for the duration of the COVID-19 declaration under Section 564(b)(1) of the Act, 21 U.S.C.section 360bbb-3(b)(1), unless the authorization is terminated  or revoked sooner.       Influenza A by PCR NEGATIVE NEGATIVE Final   Influenza B by PCR NEGATIVE NEGATIVE Final    Comment: (NOTE) The Xpert Xpress SARS-CoV-2/FLU/RSV plus assay is intended as an aid in the diagnosis of influenza from Nasopharyngeal swab specimens and should not be used as a sole basis for treatment. Nasal washings and aspirates are unacceptable for Xpert Xpress SARS-CoV-2/FLU/RSV testing.  Fact Sheet for Patients: BloggerCourse.com  Fact Sheet for Healthcare Providers: SeriousBroker.it  This test is not yet approved or cleared by the [place] FDA and has been authorized for detection and/or diagnosis of SARS-CoV-2 by FDA under an Emergency Use Authorization (EUA). This EUA will remain in effect (meaning this test can be used) for the duration of the COVID-19 declaration under Section 564(b)(1) of the Act, 21 U.S.C. section 360bbb-3(b)(1), unless the authorization is terminated or revoked.  Performed at [facility], [address], [place], [place] [address]   Urine Culture     Status: Abnormal   Collection Time: 01/25/21  9:26 PM   Specimen: Urine, Random  Result Value Ref Range Status   Specimen Description   Final    URINE, RANDOM Performed at [facility], [address], [place], [place] [address]     Special Requests   Final    NONE Performed  at [facility], [address] [place], [place] [address]    Culture (A)  Final    <10,000 COLONIES/mL INSIGNIFICANT GROWTH Performed at [facility], [address] [place], [place] [address]    Report Status 01/27/2021 FINAL  Final     Discharge Instructions:   Discharge Instructions     Diet regular   Complete by: As directed    Discharge instructions   Complete by: As directed    Follow-up with your primary care provider in 1 week.  Check blood work at that time.  Continue current medications as prescribed by psychiatry.  Follow-up with psychiatry as outpatient.   Increase activity slowly   Complete by: As directed       Allergies as of 01/31/2021       Reactions   Tobramycin Rash   Had it when a kid   Amoxicillin Rash   Reports rash        Medication List     STOP taking these medications    DULoxetine 20 MG capsule Commonly known as: CYMBALTA   Kelnor 1/35 1-35 MG-MCG tablet Generic drug: ethynodiol-ethinyl estradiol       TAKE these medications    LORazepam 0.5 MG tablet Commonly known as: Ativan Take 1 tablet (0.5 mg total) by mouth every 6 (six) hours as needed for anxiety.   risperiDONE 1 MG disintegrating tablet Commonly known as: RISPERDAL M-TABS Take 1 tablet (1 mg total) by mouth 2 (two) times daily.   traZODone 50 MG tablet Commonly known as: DESYREL Take 0.5 tablets (25 mg total) by mouth at bedtime as needed for sleep.   Vitamin D (Ergocalciferol) 1.25 MG (50000 UNIT) Caps capsule Commonly known as: DRISDOL Take 1 capsule (50,000 Units total) by mouth every 7 (seven) days. Start taking on: February 04, 2021        Follow-up Information     Primary care provider Follow up in 1 week(s).                  Time coordinating discharge: 39 minutes  Signed:  Celestine Prim  [facility] 01/31/2021, 8:24 AM

## 2021-02-01 NOTE — Discharge Summary (Signed)
Physician Discharge Summary Note  Patient:  Sandra [name] is an 20 y.o., female MRN:  [id number] DOB:  2000-06-30 Patient phone:  [phone] (home)  Patient address:   [address] [place] [address],  Total Time spent with patient: 15 minutes  Date of Admission:  01/24/2021 Date of Discharge: 01/25/2001  Reason for Admission: Patient had been admitted from the emergency room for treatment of new onset psychosis  Principal Problem: Psychosis [facility]) Discharge Diagnoses: Principal Problem:   Psychosis (HCC)   Past Psychiatric History: Past history of anxiety treated as an outpatient no previous hospitalizations no previous psychosis no previous suicide attempts or substance abuse  Past Medical History:  Past Medical History:  Diagnosis Date   Medical history non-contributory    History reviewed. No pertinent surgical history. Family History: History reviewed. No pertinent family history. Family Psychiatric  History: None reported Social History:  Social History   Substance and Sexual Activity  Alcohol Use Not Currently     Social History   Substance and Sexual Activity  Drug Use Yes   Types: Marijuana   Comment: Mushrooms, Delta 8    Social History   Socioeconomic History   Marital status: Single    Spouse name: Not on file   Number of children: Not on file   Years of education: Not on file   Highest education level: Not on file  Occupational History   Not on file  Tobacco Use   Smoking status: Some Days    Packs/day: 0.25    Years: 1.00    Pack years: 0.25    Types: Cigarettes   Smokeless tobacco: Never  Vaping Use   Vaping Use: Some days  Substance and Sexual Activity   Alcohol use: Not Currently   Drug use: Yes    Types: Marijuana    Comment: Mushrooms, Delta 8   Sexual activity: Not Currently  Other Topics Concern   Not on file  Social History Narrative   Not on file   Social Determinants of Health   Financial Resource Strain: Not on file   Food Insecurity: Not on file  Transportation Needs: Not on file  Physical Activity: Not on file  Stress: Not on file  Social Connections: Not on file    Hospital Course: Patient was admitted to the psychiatric service but upon arrival on the ward when nurses attempted to transfer port her from her wheelchair she had an episode of syncope sinking to the floor.  No reported physical injury but concerns were raised and medical consult was obtained and she was transferred to the medical service.  Physical Findings: AIMS:  , ,  ,  ,    CIWA:    COWS:     Musculoskeletal: Strength & Muscle Tone: within normal limits Gait & Station: unsteady Patient leans: N/A   Psychiatric Specialty Exam:  Presentation  General Appearance: Appropriate for Environment; Casual  Eye Contact:Fair  Speech:Blocked  Speech Volume:Normal  Handedness:Right   Mood and Affect  Mood: No data recorded Affect:Inappropriate; Full Range   Thought Process  Thought Processes:Disorganized  Descriptions of Associations:Loose  Orientation:Full (Time, Place and Person)  Thought Content:Illogical  History of Schizophrenia/Schizoaffective disorder:No  Duration of Psychotic Symptoms:N/A  Hallucinations:No data recorded Ideas of Reference:None  Suicidal Thoughts:No data recorded Homicidal Thoughts:No data recorded  Sensorium  Memory:Immediate Fair; Recent Poor  Judgment:Impaired  Insight:Lacking   Executive Functions  Concentration:Poor  Attention Span:Poor  Recall:Poor  Fund of Knowledge:Poor  Language:Poor   Psychomotor Activity  Psychomotor Activity: No data recorded  Assets  Assets:Communication Skills; Financial Resources/Insurance; Housing; Vocational/Educational; Social Support   Sleep  Sleep: No data recorded   Physical Exam: Physical Exam Constitutional:      Appearance: Normal appearance.  HENT:     Head: Normocephalic and atraumatic.     Mouth/Throat:      Pharynx: Oropharynx is clear.  Eyes:     Pupils: Pupils are equal, round, and reactive to light.  Cardiovascular:     Rate and Rhythm: Normal rate and regular rhythm.  Pulmonary:     Effort: Pulmonary effort is normal.     Breath sounds: Normal breath sounds.  Abdominal:     General: Abdomen is flat.     Palpations: Abdomen is soft.  Musculoskeletal:        General: Normal range of motion.  Skin:    General: Skin is warm and dry.  Neurological:     General: No focal deficit present.     Mental Status: She is alert. Mental status is at baseline.  Psychiatric:        Attention and Perception: She perceives auditory hallucinations.        Mood and Affect: Affect is blunt.        Behavior: Behavior is withdrawn.   Review of Systems  Psychiatric/Behavioral:  Positive for hallucinations.   Blood pressure (!) 117/58, pulse 62, temperature 97.8 F (36.6 C), resp. rate 16, height 5' 5.5" (1.664 m), weight 48.5 kg, SpO2 100 %. Body mass index is 17.53 kg/m.   Social History   Tobacco Use  Smoking Status Some Days   Packs/day: 0.25   Years: 1.00   Pack years: 0.25   Types: Cigarettes  Smokeless Tobacco Never   Tobacco Cessation:  N/A, patient does not currently use tobacco products   Blood Alcohol level:  Lab Results  Component Value Date   ETH <10 01/22/2021    Metabolic Disorder Labs:  Lab Results  Component Value Date   HGBA1C 5.0 01/25/2021   MPG 96.8 01/25/2021   No results found for: PROLACTIN Lab Results  Component Value Date   CHOL 116 01/25/2021   TRIG 35 01/25/2021   HDL 66 01/25/2021   CHOLHDL 1.8 01/25/2021   VLDL 7 01/25/2021   LDLCALC 43 01/25/2021    See Psychiatric Specialty Exam and Suicide Risk Assessment completed by Attending Physician prior to discharge.  Discharge destination: Medical unit  Is patient on multiple antipsychotic therapies at discharge:  No   Has Patient had three or more failed trials of antipsychotic monotherapy by  history:  No  Recommended Plan for Multiple Antipsychotic Therapies: NA   Allergies as of 01/25/2021       Reactions   Tobramycin Rash   Had it when a kid   Amoxicillin Rash   Reports rash        Medication List    You have not been prescribed any medications.      Follow-up recommendations:  Other:    Transport to medical unit follow-up by psychiatric service  Comments: Psychiatric service to follow up  Signed: Mordecai Rasmussen, MD 02/01/2021, 6:13 PM

## 2021-02-01 NOTE — H&P (Signed)
Psychiatric Admission Assessment Adult  Patient Identification: Nya Monds MRN:  [id number] Date of Evaluation:  01/24/2021 Chief Complaint:  Psychosis [facility]) [[id number]] Principal Diagnosis: Psychosis (HCC) Diagnosis:  Principal Problem:   Psychosis (HCC)  History of Present Illness: This is a make up history and physical for this patient.  I had seen her in the emergency room and arranged for admission to the psychiatry service.  Before she could be seen by a physician on admission she developed medical problems requiring transfer.  Briefly this is a 20 year old woman who over the last month or so has developed worsening anxiety and confusion culminating in auditory hallucinations and bizarre behavior.  Brought to the emergency room from school.  No active suicidal thoughts but confused unable to care for self.  Some possible substance abuse full details unclear. Associated Signs/Symptoms: Depression Symptoms:  anhedonia, psychomotor retardation, impaired memory, Duration of Depression Symptoms: No data recorded (Hypo) Manic Symptoms:  Distractibility, Anxiety Symptoms:  Excessive Worry, Psychotic Symptoms:  Hallucinations: Auditory PTSD Symptoms: Negative Total Time spent with patient: 30 minutes  Past Psychiatric History: Patient has a past history of some anxiety treated with therapy earlier in life.  No previous psychotic symptoms or hospitalization no history of suicide attempts or known history of substance abuse  Is the patient at risk to self? Yes.    Has the patient been a risk to self in the past 6 months? No.  Has the patient been a risk to self within the distant past? No.  Is the patient a risk to others? No.  Has the patient been a risk to others in the past 6 months? No.  Has the patient been a risk to others within the distant past? No.   Prior Inpatient Therapy:   Prior Outpatient Therapy:    Alcohol Screening: 1. How often do you have a drink containing alcohol?:  Never 2. How many drinks containing alcohol do you have on a typical day when you are drinking?: 1 or 2 3. How often do you have six or more drinks on one occasion?: Never AUDIT-C Score: 0 4. How often during the last year have you found that you were not able to stop drinking once you had started?: Never 5. How often during the last year have you failed to do what was normally expected from you because of drinking?: Never 6. How often during the last year have you needed a first drink in the morning to get yourself going after a heavy drinking session?: Never 7. How often during the last year have you had a feeling of guilt of remorse after drinking?: Never 8. How often during the last year have you been unable to remember what happened the night before because you had been drinking?: Never 9. Have you or someone else been injured as a result of your drinking?: No 10. Has a relative or friend or a doctor or another health worker been concerned about your drinking or suggested you cut down?: No Alcohol Use Disorder Identification Test Final Score (AUDIT): 0 Substance Abuse History in the last 12 months:  No. Consequences of Substance Abuse: Negative Previous Psychotropic Medications: No  Psychological Evaluations: No  Past Medical History:  Past Medical History:  Diagnosis Date   Medical history non-contributory    History reviewed. No pertinent surgical history. Family History: History reviewed. No pertinent family history. Family Psychiatric  History: No information known Tobacco Screening:   Social History:  Social History   Substance and Sexual Activity  Alcohol Use Not Currently     Social History   Substance and Sexual Activity  Drug Use Yes   Types: Marijuana   Comment: Mushrooms, Delta 8    Additional Social History:                           Allergies:   Allergies  Allergen Reactions   Tobramycin Rash    Had it when a kid    Amoxicillin Rash     Reports rash   Lab Results: No results found for this or any previous visit (from the past 48 hour(s)).  Blood Alcohol level:  Lab Results  Component Value Date   ETH <10 01/22/2021    Metabolic Disorder Labs:  Lab Results  Component Value Date   HGBA1C 5.0 01/25/2021   MPG 96.8 01/25/2021   No results found for: PROLACTIN Lab Results  Component Value Date   CHOL 116 01/25/2021   TRIG 35 01/25/2021   HDL 66 01/25/2021   CHOLHDL 1.8 01/25/2021   VLDL 7 01/25/2021   LDLCALC 43 01/25/2021    Current Medications: No current facility-administered medications for this encounter.   Current Outpatient Medications  Medication Sig Dispense Refill   LORazepam (ATIVAN) 0.5 MG tablet Take 1 tablet (0.5 mg total) by mouth every 6 (six) hours as needed for anxiety. 60 tablet 0   risperiDONE (RISPERDAL M-TABS) 1 MG disintegrating tablet Take 1 tablet (1 mg total) by mouth 2 (two) times daily. 60 tablet 1   traZODone (DESYREL) 50 MG tablet Take 0.5 tablets (25 mg total) by mouth at bedtime as needed for sleep. 30 tablet 0   [START ON 02/04/2021] Vitamin D, Ergocalciferol, (DRISDOL) 1.25 MG (50000 UNIT) CAPS capsule Take 1 capsule (50,000 Units total) by mouth every 7 (seven) days. 5 capsule 0   PTA Medications: No medications prior to admission.    Musculoskeletal: Strength & Muscle Tone: within normal limits Gait & Station: normal Patient leans: N/A            Psychiatric Specialty Exam:  Presentation  General Appearance: Appropriate for Environment; Casual  Eye Contact:Fair  Speech:Blocked  Speech Volume:Normal  Handedness:Right   Mood and Affect  Mood: No data recorded Affect:Inappropriate; Full Range   Thought Process  Thought Processes:Disorganized  Duration of Psychotic Symptoms: N/A  Past Diagnosis of Schizophrenia or Psychoactive disorder: No  Descriptions of Associations:Loose  Orientation:Full (Time, Place and Person)  Thought  Content:Illogical  Hallucinations:No data recorded Ideas of Reference:None  Suicidal Thoughts:No data recorded Homicidal Thoughts:No data recorded  Sensorium  Memory:Immediate Fair; Recent Poor  Judgment:Impaired  Insight:Lacking   Executive Functions  Concentration:Poor  Attention Span:Poor  Recall:Poor  Fund of Knowledge:Poor  Language:Poor   Psychomotor Activity  Psychomotor Activity: No data recorded  Assets  Assets:Communication Skills; Financial Resources/Insurance; Housing; Vocational/Educational; Social Support   Sleep  Sleep: No data recorded   Physical Exam: Physical Exam Vitals and nursing note reviewed.  Constitutional:      Appearance: Normal appearance.  HENT:     Head: Normocephalic and atraumatic.     Mouth/Throat:     Pharynx: Oropharynx is clear.  Eyes:     Pupils: Pupils are equal, round, and reactive to light.  Cardiovascular:     Rate and Rhythm: Normal rate and regular rhythm.  Pulmonary:     Effort: Pulmonary effort is normal.     Breath sounds: Normal breath sounds.  Abdominal:     General:  Abdomen is flat.     Palpations: Abdomen is soft.  Musculoskeletal:        General: Normal range of motion.  Skin:    General: Skin is warm and dry.  Neurological:     General: No focal deficit present.     Mental Status: She is alert. Mental status is at baseline.  Psychiatric:        Attention and Perception: She is inattentive. She perceives auditory hallucinations.        Mood and Affect: Mood is anxious. Affect is blunt.        Speech: Speech is delayed.        Behavior: Behavior is withdrawn.        Thought Content: Thought content is paranoid.   Review of Systems  Constitutional: Negative.   HENT: Negative.    Eyes: Negative.   Respiratory: Negative.    Cardiovascular: Negative.   Gastrointestinal: Negative.   Musculoskeletal: Negative.   Skin: Negative.   Neurological: Negative.   Psychiatric/Behavioral:  Positive  for hallucinations. The patient is nervous/anxious.   Blood pressure (!) 117/58, pulse 62, temperature 97.8 F (36.6 C), resp. rate 16, height 5' 5.5" (1.664 m), weight 48.5 kg, SpO2 100 %. Body mass index is 17.53 kg/m.  Treatment Plan Summary: Plan patient was to be admitted to the inpatient psychiatric service because of new onset psychosis.  Upon arrival on the inpatient psychiatric ward patient suffered an episode of syncope.  Medical consult was obtained and she was transferred to the internal medicine service.  Followed up by psychiatric team.  Observation Level/Precautions:  15 minute checks  Laboratory:  UDS  Psychotherapy:    Medications:    Consultations:    Discharge Concerns:    Estimated LOS:  Other:     Physician Treatment Plan for Primary Diagnosis: Psychosis (HCC) Long Term Goal(s): Improvement in symptoms so as ready for discharge  Short Term Goals: Ability to verbalize feelings will improve  Physician Treatment Plan for Secondary Diagnosis: Principal Problem:   Psychosis (HCC)  Long Term Goal(s): Improvement in symptoms so as ready for discharge  Short Term Goals: Compliance with prescribed medications will improve  I certify that inpatient services furnished can reasonably be expected to improve the patient's condition.    Mordecai Rasmussen, MD 9/20/20226:10 PM

## 2021-05-19 ENCOUNTER — Other Ambulatory Visit: Payer: Self-pay

## 2021-05-19 ENCOUNTER — Encounter: Payer: Self-pay | Admitting: Psychiatry

## 2021-05-19 ENCOUNTER — Ambulatory Visit (INDEPENDENT_AMBULATORY_CARE_PROVIDER_SITE_OTHER): Payer: PRIVATE HEALTH INSURANCE | Admitting: Psychiatry

## 2021-05-19 VITALS — BP 99/66 | HR 49 | Temp 98.1°F | Wt 119.2 lb

## 2021-05-19 DIAGNOSIS — F1211 Cannabis abuse, in remission: Secondary | ICD-10-CM | POA: Diagnosis not present

## 2021-05-19 DIAGNOSIS — F32A Depression, unspecified: Secondary | ICD-10-CM

## 2021-05-19 DIAGNOSIS — F411 Generalized anxiety disorder: Secondary | ICD-10-CM

## 2021-05-19 DIAGNOSIS — F23 Brief psychotic disorder: Secondary | ICD-10-CM

## 2021-05-19 MED ORDER — DULOXETINE HCL 20 MG PO CPEP: 40.0000 mg | ORAL_CAPSULE | Freq: Every day | ORAL | 0 refills | Status: AC

## 2021-05-19 NOTE — Patient Instructions (Signed)
Managing Anxiety, Adult ?After being diagnosed with anxiety, you may be relieved to know why you have felt or behaved a certain way. You may also feel overwhelmed about the treatment ahead and what it will mean for your life. With care and support, you can manage this condition. ?How to manage lifestyle changes ?Managing stress and anxiety ?Stress is your body's reaction to life changes and events, both good and bad. Most stress will last just a few hours, but stress can be ongoing and can lead to more than just stress. Although stress can play a major role in anxiety, it is not the same as anxiety. Stress is usually caused by something external, such as a deadline, test, or competition. Stress normally passes after the triggering event has ended.  ?Anxiety is caused by something internal, such as imagining a terrible outcome or worrying that something will go wrong that will devastate you. Anxiety often does not go away even after the triggering event is over, and it can become long-term (chronic) worry. It is important to understand the differences between stress and anxiety and to manage your stress effectively so that it does not lead to an anxious response. ?Talk with your health care provider or a counselor to learn more about reducing anxiety and stress. He or she may suggest tension reduction techniques, such as: ?Music therapy. Spend time creating or listening to music that you enjoy and that inspires you. ?Mindfulness-based meditation. Practice being aware of your normal breaths while not trying to control your breathing. It can be done while sitting or walking. ?Centering prayer. This involves focusing on a word, phrase, or sacred image that means something to you and brings you peace. ?Deep breathing. To do this, expand your stomach and inhale slowly through your nose. Hold your breath for 3-5 seconds. Then exhale slowly, letting your stomach muscles relax. ?Self-talk. Learn to notice and identify  thought patterns that lead to anxiety reactions and change those patterns to thoughts that feel peaceful. ?Muscle relaxation. Taking time to tense muscles and then relax them. ?Choose a tension reduction technique that fits your lifestyle and personality. These techniques take time and practice. Set aside 5-15 minutes a day to do them. Therapists can offer counseling and training in these techniques. The training to help with anxiety may be covered by some insurance plans. ?Other things you can do to manage stress and anxiety include: ?Keeping a stress diary. This can help you learn what triggers your reaction and then learn ways to manage your response. ?Thinking about how you react to certain situations. You may not be able to control everything, but you can control your response. ?Making time for activities that help you relax and not feeling guilty about spending your time in this way. ?Doing visual imagery. This involves imagining or creating mental pictures to help you relax. ?Practicing yoga. Through yoga poses, you can lower tension and promote relaxation. ? ?Medicines ?Medicines can help ease symptoms. Medicines for anxiety include: ?Antidepressant medicines. These are usually prescribed for long-term daily control. ?Anti-anxiety medicines. These may be added in severe cases, especially when panic attacks occur. ?Medicines will be prescribed by a health care provider. When used together, medicines, psychotherapy, and tension reduction techniques may be the most effective treatment. ?Relationships ?Relationships can play a big part in helping you recover. Try to spend more time connecting with trusted friends and family members. ?Consider going to couples counseling if you have a partner, taking family education classes, or going to family   therapy. ?Therapy can help you and others better understand your condition. ?How to recognize changes in your anxiety ?Everyone responds differently to treatment for  anxiety. Recovery from anxiety happens when symptoms decrease and stop interfering with your daily activities at home or work. This may mean that you will start to: ?Have better concentration and focus. Worry will interfere less in your daily thinking. ?Sleep better. ?Be less irritable. ?Have more energy. ?Have improved memory. ?It is also important to recognize when your condition is getting worse. Contact your health care provider if your symptoms interfere with home or work and you feel like your condition is not improving. ?Follow these instructions at home: ?Activity ?Exercise. Adults should do the following: ?Exercise for at least 150 minutes each week. The exercise should increase your heart rate and make you sweat (moderate-intensity exercise). ?Strengthening exercises at least twice a week. ?Get the right amount and quality of sleep. Most adults need 7-9 hours of sleep each night. ?Lifestyle ? ?Eat a healthy diet that includes plenty of vegetables, fruits, whole grains, low-fat dairy products, and lean protein. ?Do not eat a lot of foods that are high in fats, added sugars, or salt (sodium). ?Make choices that simplify your life. ?Do not use any products that contain nicotine or tobacco. These products include cigarettes, chewing tobacco, and vaping devices, such as e-cigarettes. If you need help quitting, ask your health care provider. ?Avoid caffeine, alcohol, and certain over-the-counter cold medicines. These may make you feel worse. Ask your pharmacist which medicines to avoid. ?General instructions ?Take over-the-counter and prescription medicines only as told by your health care provider. ?Keep all follow-up visits. This is important. ?Where to find support ?You can get help and support from these sources: ?Self-help groups. ?Online and community organizations. ?A trusted spiritual leader. ?Couples counseling. ?Family education classes. ?Family therapy. ?Where to find more information ?You may find  that joining a support group helps you deal with your anxiety. The following sources can help you locate counselors or support groups near you: ?Mental Health America: www.mentalhealthamerica.net ?Anxiety and Depression Association of America (ADAA): www.adaa.org ?National Alliance on Mental Illness (NAMI): www.nami.org ?Contact a health care provider if: ?You have a hard time staying focused or finishing daily tasks. ?You spend many hours a day feeling worried about everyday life. ?You become exhausted by worry. ?You start to have headaches or frequently feel tense. ?You develop chronic nausea or diarrhea. ?Get help right away if: ?You have a racing heart and shortness of breath. ?You have thoughts of hurting yourself or others. ?If you ever feel like you may hurt yourself or others, or have thoughts about taking your own life, get help right away. Go to your nearest emergency department or: ?Call your local emergency services (911 in the [place] ?Call a suicide crisis helpline, such as the National Suicide Prevention Lifeline at 1-800-273-8255 or 988 in the [place] This is open 24 hours a day in the [place] ?Text the Crisis Text Line at 741741 (in the [place] ?Summary ?Taking steps to learn and use tension reduction techniques can help calm you and help prevent triggering an anxiety reaction. ?When used together, medicines, psychotherapy, and tension reduction techniques may be the most effective treatment. ?Family, friends, and partners can play a big part in supporting you. ?This information is not intended to replace advice given to you by your health care provider. Make sure you discuss any questions you have with your health care provider. ?Document Revised: 11/24/2020 Document Reviewed: 08/22/2020 ?Elsevier Patient   Education ? 2022 [employer] ? ?

## 2021-05-19 NOTE — Progress Notes (Addendum)
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  Patient status post discharge from medical unit-was seen by our consult team on the unit-Dr. Clapacs-presented to the ED on 01/23/2021-admitted and discharged 01/31/2021.  Patient presented to the emergency department with abnormal behavior, psychosis, mood lability, sleep problems, was treated with medications like risperidone 1 mg twice a day, lorazepam as needed and was discharged.  Patient thereafter flew to [place] with her family and was evaluated by her provider at [place].  I have reviewed notes from her psychiatrist Dr. Lesli Albee -dated most recent- 04/2021,03/2021.  Patient was treated with L-methylfolate 15 mg, restarted on duloxetine 20 mg.'  Patient today appeared to be a limited historian.  Could not give a lot of information about her hospitalization.  Does not elaborate much about her substance use including cannabis.  Patient did not mention use of mushrooms, delta 8-although that was noted in her medical records ( Ms.Lord NP - dated 01/23/2021)  leading to the hospitalization.  We will need to explore the extent of this in future sessions.  Patient continues to have depressive symptoms, lack of motivation, low energy, and appetite reduction.  Cymbalta which was restarted at 20 mg 2 weeks ago has not made much difference with her mood symptoms per report.  Patient's father who was present in session wonders whether the  Cymbalta likely could also be contributing to appetite reduction.  Unknown if this is a factor however it is worth monitoring in future sessions.  Patient reports she could put some effort into eating more although she is currently eating smaller portions.  As noted above based on review of medical records pt with malnutrition-as per notes per dietitian dated 01/26/2021.  Patient reports sleep is overall good.  Patient denies any suicidality, homicidality or perceptual disturbances.  Did not appear to be preoccupied with any delusions although she appeared to be guarded in session.  Patient denies any substance abuse at this time.   Patient also had recent COVID-19 infection prior to her admission to the hospital with the above symptoms.  Currently denies any residual symptoms.  Her father is currently staying with her to support her and see if she will be able to tolerate being back at [facility].  As per collateral information from father patient had a lot of stressors leading up to her hospitalization, had a hard summer, worked at [employer] and also had COVID-19 infection as well as isolation at [employer].   She continues to have good support system from her therapist in [place] whom she sees once a week or so by video appointment.    Visit Diagnosis: R/O Bipolar disorder versus MDD   ICD-10-CM   1. Brief psychotic disorder ([place])  F23 DULoxetine (CYMBALTA) 20 MG capsule   unspecified likely secondary to substance use versus medical causes    2. GAD (generalized anxiety disorder)  F41.1 DULoxetine (CYMBALTA) 20 MG capsule    3. Depression, unspecified depression type  F32.A     4. History of cannabis abuse  F12.11       Past Psychiatric History: Patient with recent emergency department visit for psychosis, unspecified, likely substance induced, has a history of generalized anxiety disorder, was recommended to be admitted to the psychiatric unit however had a syncopal episode on arrival to the unit  and was transferred to the medical unit where she was treated and discharged on 01/31/2021.  Patient with no prior suicide attempts.  Denies previous inpatient psychiatric admission.  Has a psychiatrist at [facility] -most recent visit was in December 2022.  Past trials of medications like Lexapro-2020, Zoloft-50 mg-increased anxiety, Cymbalta-previously helpful-stopped during hospitalization-restarted.  Risperdal-horrible side effects, Abilify-akathisia. Dyslexia-testing done-05/29/2017-we will need to obtain records. Patient was referred for ASD eval per therapist-02/23/2021-pending  Past Medical History:  Past Medical History:  Diagnosis Date   Anxiety    Depression    Medical history non-contributory    History reviewed. No pertinent surgical history.  Family Psychiatric History: As noted below  Family History:  Family History  Problem Relation Age of Onset   Depression Father    Anxiety disorder Father    Anxiety disorder Sister    Depression Sister    Alcohol abuse Maternal Grandmother     Social History: Patient was born in [place].  Raised by both parents.  Had a good childhood.  Has 2 siblings.  Reports a good relationship with them.  Currently a sophomore at [employer].  Currently lives in [place] with several roommates.  Reports good support system from friends.  Denies any history of sexual, physical trauma.  Denies any legal problems. Social History   Socioeconomic History   Marital status: Single    Spouse name: Not on file   Number of children: 0   Years of education: Not on file   Highest education level: Some college, no degree  Occupational History   Not on file  Tobacco Use   Smoking status: Some Days    Packs/day: 0.25    Years: 1.00    Pack years: 0.25    Types: Cigarettes   Smokeless tobacco: Never   Tobacco comments:    Lot less - once a week smokes or vpes  Vaping Use   Vaping Use: Some days  Substance and Sexual Activity   Alcohol  use: Not Currently    Comment: social 3 in a week  maybe   Drug use: Yes    Types: Marijuana    Comment: Mushrooms, Delta 8   Sexual activity: Not Currently  Other Topics Concern   Not on file  Social History Narrative   Not on file   Social Determinants of Health   Financial Resource Strain: Not on file  Food Insecurity: Not on file  Transportation Needs: Not on file  Physical Activity: Not on file  Stress: Not on file  Social Connections: Not on file    Allergies:  Allergies  Allergen Reactions   Tobramycin Rash    Had it when a kid    Amoxicillin Rash    Reports rash    Metabolic Disorder Labs: Lab Results  Component Value Date   HGBA1C 5.0 01/25/2021   MPG 96.8 01/25/2021   No results found for: PROLACTIN Lab Results  Component Value Date   CHOL 116 01/25/2021   TRIG 35 01/25/2021   HDL 66 01/25/2021   CHOLHDL 1.8 01/25/2021   VLDL 7 01/25/2021   LDLCALC 43 01/25/2021   Lab Results  Component Value Date   TSH 1.628 01/25/2021  TSH 1.973 01/25/2021    Therapeutic Level Labs: No results found for: LITHIUM No results found for: VALPROATE No components found for:  CBMZ  Current Medications: Current Outpatient Medications  Medication Sig Dispense Refill   clindamycin (CLEOCIN T) 1 % external solution Apply topically.     L-Methylfolate-Algae (DEPLIN 15 PO) Take 15 mg by mouth.     LORazepam (ATIVAN) 0.5 MG tablet Take by mouth.     tretinoin (RETIN-A) 0.1 % cream Apply a pea size amount to face daily at bedtime for acne     DULoxetine (CYMBALTA) 20 MG capsule Take 2 capsules (40 mg total) by mouth daily. 180 capsule 0   paragard intrauterine copper IUD IUD See admin instructions.     Vitamin D, Ergocalciferol, (DRISDOL) 1.25 MG (50000 UNIT) CAPS capsule Take 1 capsule (50,000 Units total) by mouth every 7 (seven) days. 5 capsule 0   No current facility-administered medications for this visit.     Musculoskeletal: Strength & Muscle Tone: within  normal limits Gait & Station: normal Patient leans: N/A  Psychiatric Specialty Exam: Review of Systems  Constitutional:  Positive for appetite change and fatigue.  Psychiatric/Behavioral:  Positive for dysphoric mood.   All other systems reviewed and are negative.  Blood pressure 99/66, pulse (!) 49, temperature 98.1 F (36.7 C), temperature source Temporal, weight 119 lb 3.2 oz (54.1 kg), last menstrual period 05/19/2021.Body mass index is 19.53 kg/m.  General Appearance: Casual  Eye Contact:  Poor  Speech:  Normal Rate  Volume:  Decreased  Mood:  Depressed  Affect:  Flat  Thought Process:  Goal Directed and Descriptions of Associations: Intact  Orientation:  Full (Time, Place, and Person)  Thought Content: Logical   Suicidal Thoughts:  No  Homicidal Thoughts:  No  Memory:  Immediate;   Fair Recent;   Fair Remote;   Fair  Judgement:  Fair  Insight:  Shallow  Psychomotor Activity:  Normal  Concentration:  Concentration: Fair and Attention Span: Fair  Recall:  [employer] of Knowledge: Fair  Language: Fair  Akathisia:  No  Handed:  Right  AIMS (if indicated): done, 0  Assets:  Communication Skills Desire for Improvement Housing Social Support  ADL's:  Intact  Cognition: WNL  Sleep:  Fair   Screenings: AUDIT    Flowsheet Row Admission (Discharged) from 01/24/2021 in [facility]  Alcohol Use Disorder Identification Test Final Score (AUDIT) 0      PHQ2-9    [place] Office Visit from 05/19/2021 in [facility]  PHQ-2 Total Score 6  PHQ-9 Total Score 15      [place] Visit from 05/19/2021 in [facility] Admission (Discharged) from 01/25/2021 in [place] (1C) Admission (Discharged) from 01/24/2021 in [facility] No Risk No Risk No Risk        Assessment and Plan: Yuleidi Seher is a 21 year old  Caucasian female, currently a Ship broker at [employer], has a history of generalized anxiety disorder, 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  Patient continues to struggle with possible depressive symptoms, appetite changes, will benefit from the following plan.  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  Depression unspecified-unstable Increase Cymbalta to 40 mg p.o. daily. Patient advised to monitor her appetite, if she notices any worsening of her appetite or has other side effects we will consider taking her off of the Cymbalta and consider another medication. Patient to continue CBT with her therapist.  Generalized anxiety disorder-unstable Increase Cymbalta to 40 mg p.o. daily Continue CBT Patient also has lorazepam as needed available which she rarely takes, recently prescribed by inpatient psychiatric provider.   History of cannabis abuse-we will need to explore the extent of this.  We will need to rule out other substance use.  Will monitor this patient due to her weight loss, appetite change, will need to have monitoring of BMI, heart rate, blood pressure and other labs in future sessions.  We will need to request medical records from primary care provider since she was advised to get labs including magnesium after her discharge from the medical floor.  I reviewed medical records from psychiatrist [place] -in [place] recent notes12/10/2020-as noted above. I have reviewed notes per Dr. Rolan Lipa recent admission dated 01/23/2021 as noted above. I have reviewed medical records from hospitalist-discharge  summary-Dr.Pokhrel dated 01/31/2021-patient was advised to follow up with primary care provider in a week to check CBC, BMP, magnesium.  I have reviewed genomind testing PGx report dated 03/01/2021-per review of testing-patient may have increased exposure and slow or delayed or poor response to antipsychotics.  Options for treatment with regards to antipsychotics include cariprazine, Latuda or lurasidone, olanzapine, quetiapine, ziprasidone, loxapine as they are not metabolized through 2D 6 (less likely to have increased drug exposure) .   Of note-patient is a 2 D6 poor metabolizer.  I have reviewed the following labs-01/26/2021-phosphorus-3.1-within normal limits, magnesium-1.8-within normal limits, folate-7.1, iron-within normal limits, vitamin D-within normal limits, vitamin B12-within normal limits, TSH-1.628-within normal limits.  Collateral information was obtained from father, Marylyn Ishihara  as noted above.    Follow-up in clinic in 2 weeks or sooner if needed.  Patient would like the next appointment to be virtual.  However discussed with patient that she will need in person appointments since her symptoms are currently unstable  (as well as she will need continuous monitoring of her weight, BMI, blood pressure, heart rate as well as needs to be assessed in person first possible adverse side effects to her medications ) however we could have a virtual appointment for the next one due to patient preference.  This note was generated in part or whole with voice recognition software. Voice recognition is usually quite accurate but there are transcription errors that can and very often do occur. I apologize for any typographical errors that were not detected and corrected.     Ursula Alert, MD 05/20/2021, 1:34 PM

## 2021-05-20 DIAGNOSIS — F32A Depression, unspecified: Secondary | ICD-10-CM | POA: Insufficient documentation

## 2021-06-06 ENCOUNTER — Other Ambulatory Visit: Payer: Self-pay

## 2021-06-06 ENCOUNTER — Telehealth: Payer: PRIVATE HEALTH INSURANCE | Admitting: Psychiatry

## 2022-09-08 IMAGING — CT CT HEAD WO/W CM
3 of 4 series · 16 of 47 positions shown, 19 images · IV contrast (omnipaque)
Comparison: CT head 01/25/2021

CLINICAL DATA: Mental status change.  Psychosis.

EXAM:
CT HEAD WITHOUT AND WITH CONTRAST
TECHNIQUE: Contiguous axial images were obtained from the base of the skull
through the vertex without and with intravenous contrast
CONTRAST:  75mL OMNIPAQUE IOHEXOL 350 MG/ML SOLN

[Series 2: head wo · axial · 0.41mm/px · z∈[+404,+539]mm · 10 of 33 slices shown, 13 images]
[im 3/33  brain]
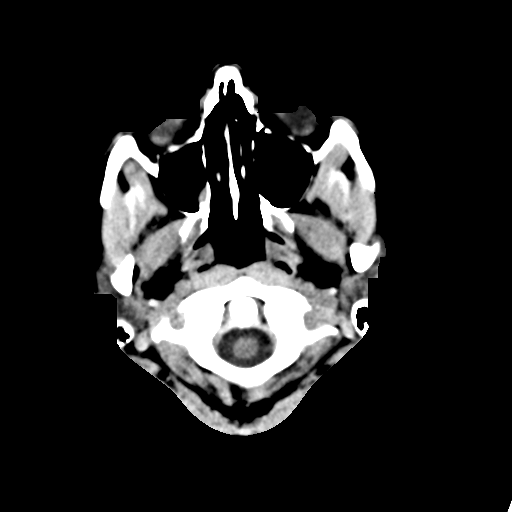
[im 3/33  bone]
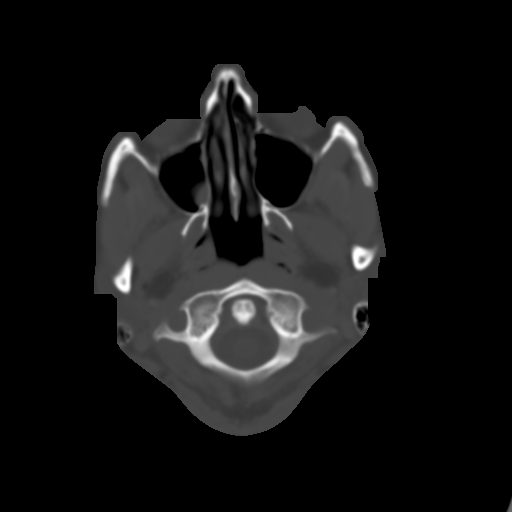
[im 5/33  brain]
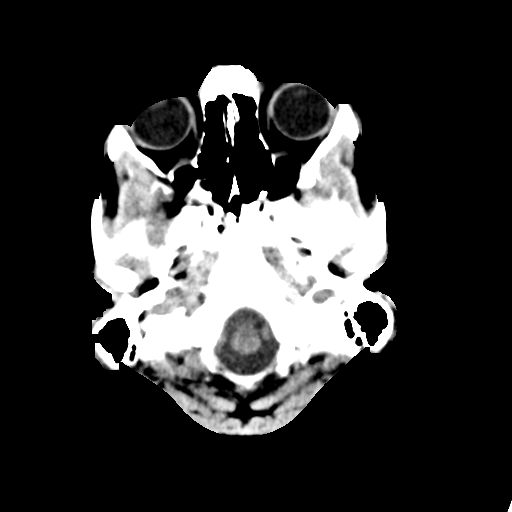
[im 10/33  brain]
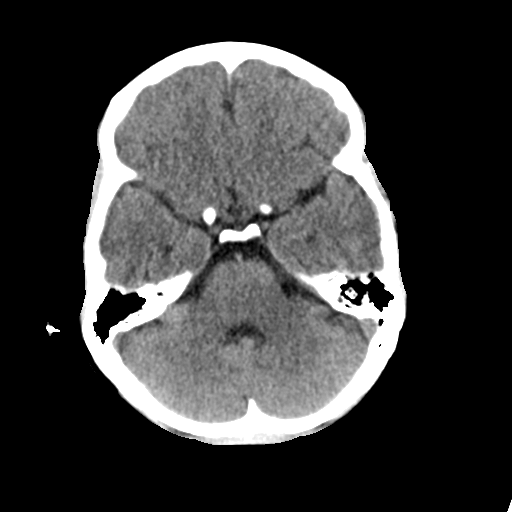
[im 12/33  brain]
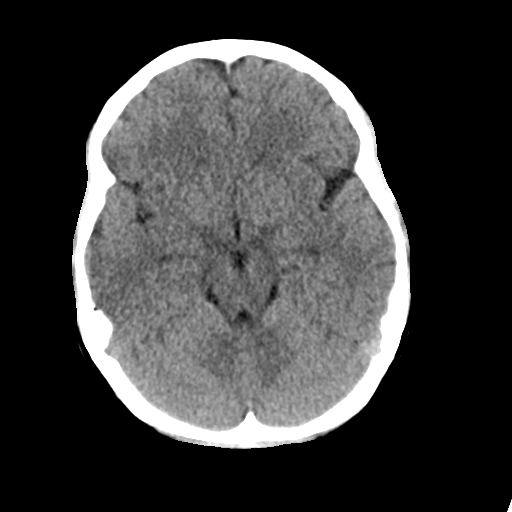
[im 14/33  brain]
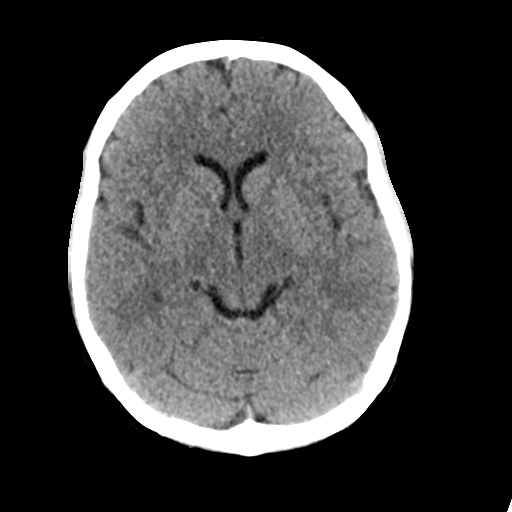
[im 14/33  bone]
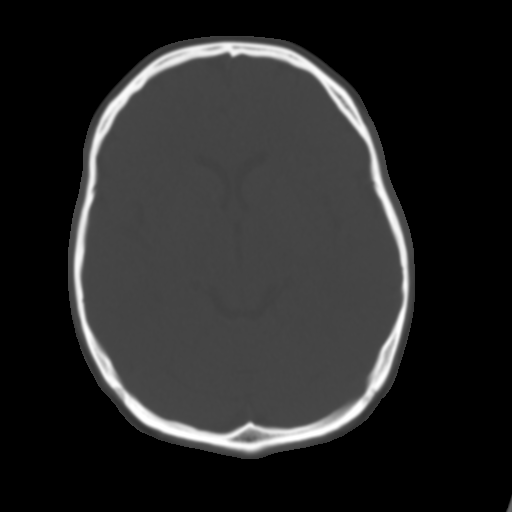
[im 19/33  brain]
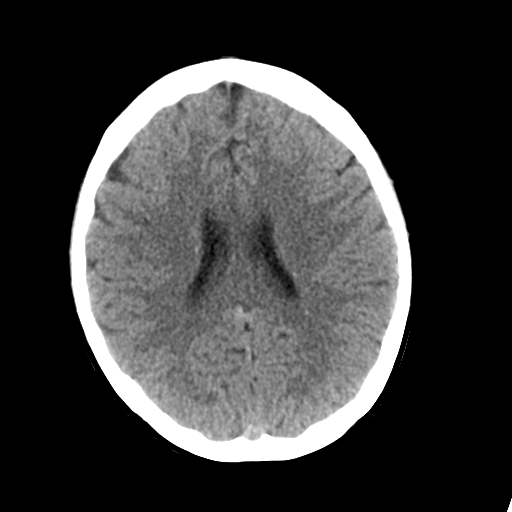
[im 21/33  brain]
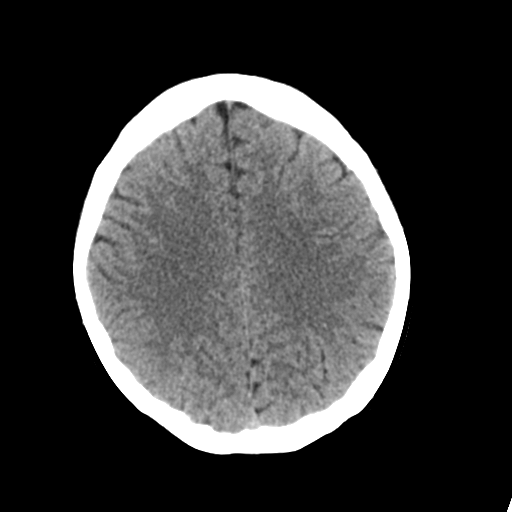
[im 23/33  brain]
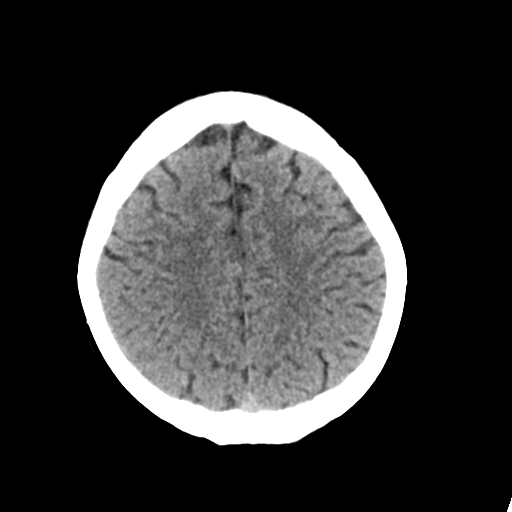
[im 28/33  brain]
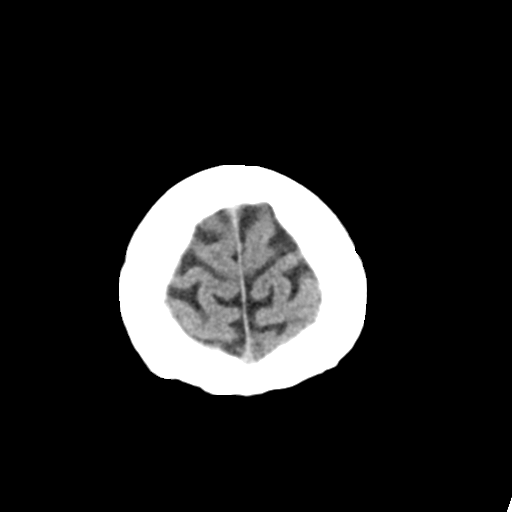
[im 28/33  bone]
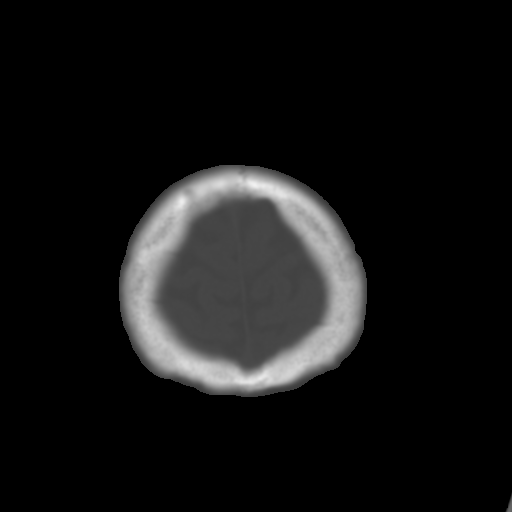
[im 30/33  brain]
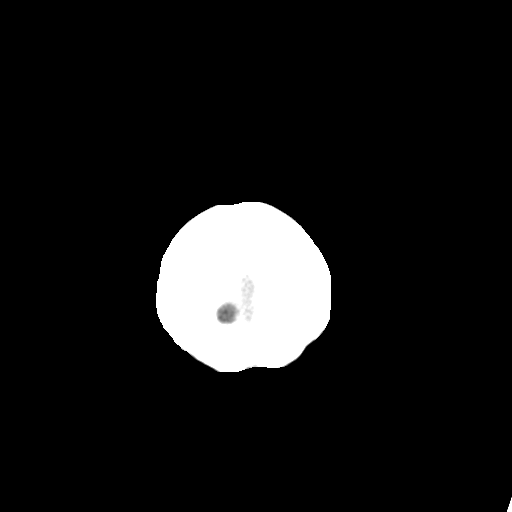

[Series 5: coronal soft tissue · coronal · 0.33mm/px · 3 of 67 slices shown]
[im 23/67  brain]
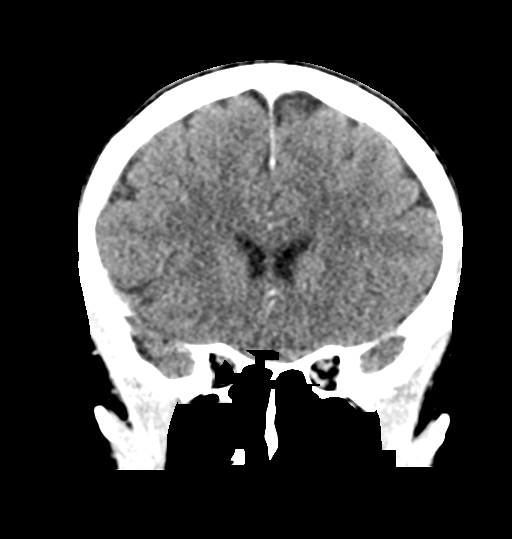
[im 30/67  brain]
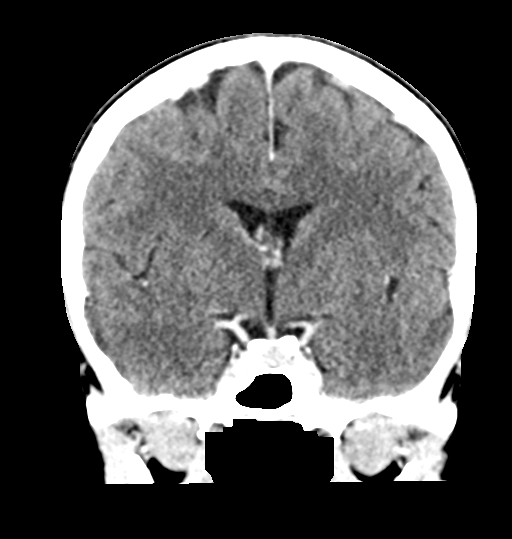
[im 37/67  brain]
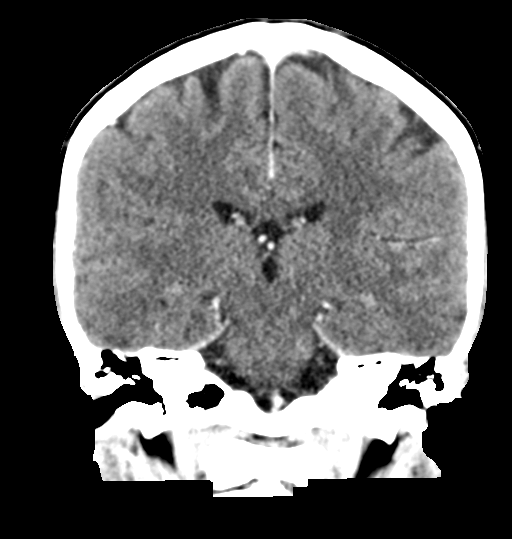

[Series 6: sagittal soft tissue · sagittal · 0.33mm/px · 3 of 57 slices shown]
[im 19/57  brain]
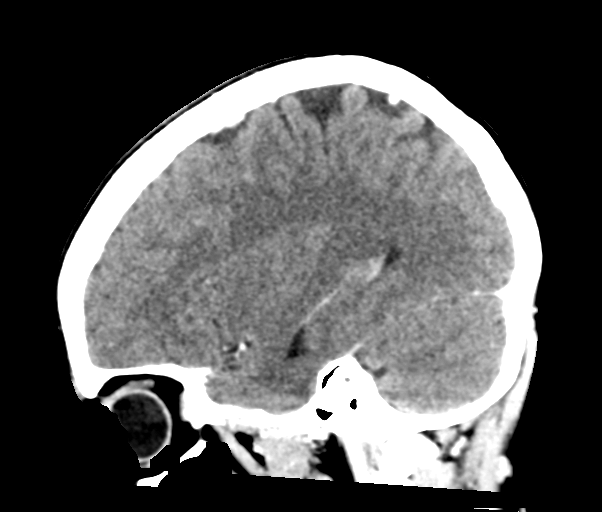
[im 29/57  brain]
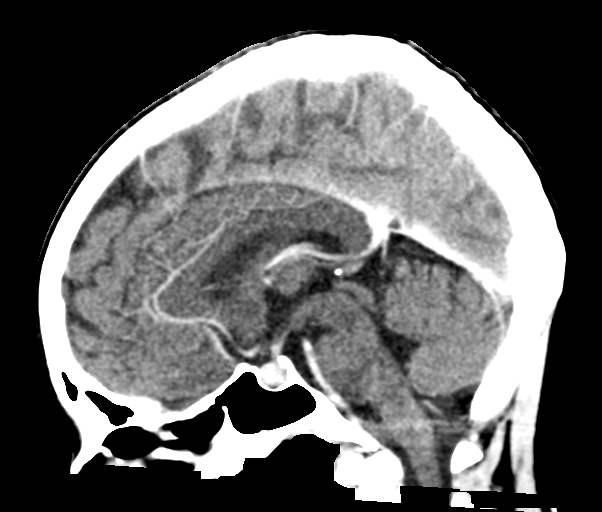
[im 38/57  brain]
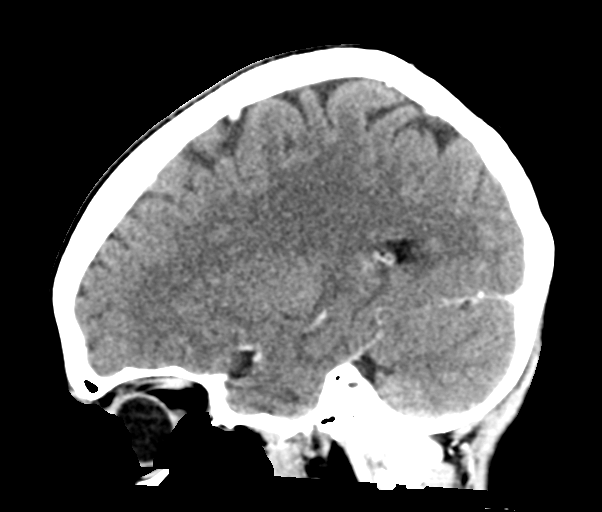

[16 of 47 positions shown; findings below may reference images not displayed]

FINDINGS: Brain: No evidence of acute infarction, hemorrhage, hydrocephalus,
extra-axial collection or mass lesion/mass effect.

Normal enhancement following contrast administration

Vascular: Negative for hyperdense vessel. Normal arterial and venous
enhancement.

Skull: Negative

Sinuses/Orbits: Mild mucosal edema right maxillary sinus. Remaining
sinuses clear. Negative orbit

Other: None
IMPRESSION: Normal CT of the brain with contrast.
